# Patient Record
Sex: Female | Born: 1990 | Race: Black or African American | Hispanic: No | Marital: Single | Smoking: Never smoker
Health system: Southern US, Community
[De-identification: ages and names within clinical notes are randomized; demographics above are authoritative.]

## PROBLEM LIST (undated history)

## (undated) DIAGNOSIS — H4710 Unspecified papilledema: Secondary | ICD-10-CM

## (undated) DIAGNOSIS — J45909 Unspecified asthma, uncomplicated: Secondary | ICD-10-CM

## (undated) DIAGNOSIS — A64 Unspecified sexually transmitted disease: Secondary | ICD-10-CM

## (undated) DIAGNOSIS — E063 Autoimmune thyroiditis: Secondary | ICD-10-CM

## (undated) HISTORY — PX: WISDOM TOOTH EXTRACTION: SHX21

## (undated) HISTORY — DX: Unspecified papilledema: H47.10

## (undated) HISTORY — DX: Unspecified sexually transmitted disease: A64

## (undated) HISTORY — DX: Unspecified asthma, uncomplicated: J45.909

## (undated) HISTORY — PX: MYRINGOTOMY: SHX2060

---
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
## 2013-11-07 ENCOUNTER — Ambulatory Visit (INDEPENDENT_AMBULATORY_CARE_PROVIDER_SITE_OTHER): Payer: BC Managed Care – PPO | Admitting: Certified Nurse Midwife

## 2013-11-07 ENCOUNTER — Encounter: Payer: Self-pay | Admitting: Certified Nurse Midwife

## 2013-11-07 VITALS — BP 110/70 | HR 68 | Resp 16 | Ht 62.75 in | Wt 252.0 lb

## 2013-11-07 DIAGNOSIS — B3731 Acute candidiasis of vulva and vagina: Secondary | ICD-10-CM

## 2013-11-07 DIAGNOSIS — Z01419 Encounter for gynecological examination (general) (routine) without abnormal findings: Secondary | ICD-10-CM

## 2013-11-07 DIAGNOSIS — B373 Candidiasis of vulva and vagina: Secondary | ICD-10-CM

## 2013-11-07 DIAGNOSIS — Z124 Encounter for screening for malignant neoplasm of cervix: Secondary | ICD-10-CM

## 2013-11-07 DIAGNOSIS — Z0000 Encounter for general adult medical examination without abnormal findings: Secondary | ICD-10-CM

## 2013-11-07 LAB — POCT URINALYSIS DIPSTICK
Bilirubin, UA: NEGATIVE
Blood, UA: NEGATIVE
Glucose, UA: NEGATIVE
Ketones, UA: NEGATIVE
Nitrite, UA: NEGATIVE
Protein, UA: NEGATIVE
Urobilinogen, UA: NEGATIVE
pH, UA: 5

## 2013-11-07 MED ORDER — TERCONAZOLE 0.4 % VA CREA: 1.0000 | TOPICAL_CREAM | Freq: Every day | VAGINAL | Status: DC

## 2013-11-07 NOTE — Patient Instructions (Signed)
General topics  Next pap or exam is  due in 1 year Take a Women's multivitamin Take 1200 mg. of calcium daily - prefer dietary If any concerns in interim to call back  Breast Self-Awareness Practicing breast self-awareness may pick up problems early, prevent significant medical complications, and possibly save your life. By practicing breast self-awareness, you can become familiar with how your breasts look and feel and if your breasts are changing. This allows you to notice changes early. It can also offer you some reassurance that your breast health is good. One way to learn what is normal for your breasts and whether your breasts are changing is to do a breast self-exam. If you find a lump or something that was not present in the past, it is best to contact your caregiver right away. Other findings that should be evaluated by your caregiver include nipple discharge, especially if it is bloody; skin changes or reddening; areas where the skin seems to be pulled in (retracted); or new lumps and bumps. Breast pain is seldom associated with cancer (malignancy), but should also be evaluated by a caregiver. BREAST SELF-EXAM The best time to examine your breasts is 5 7 days after your menstrual period is over.  ExitCare Patient Information 2013 [employer].   Exercise to Stay Healthy Exercise helps you become and stay healthy. EXERCISE IDEAS AND TIPS Choose exercises that:  You enjoy.  Fit into your day. You do not need to exercise really hard to be healthy. You can do exercises at a slow or medium level and stay healthy. You can:  Stretch before and after working out.  Try yoga, Pilates, or tai chi.  Lift weights.  Walk fast, swim, jog, run, climb stairs, bicycle, dance, or rollerskate.  Take aerobic classes. Exercises that burn about 150 calories:  Running 1  miles in 15 minutes.  Playing volleyball for 45 to 60 minutes.  Washing and waxing a car for 45 to 60  minutes.  Playing touch football for 45 minutes.  Walking 1  miles in 35 minutes.  Pushing a stroller 1  miles in 30 minutes.  Playing basketball for 30 minutes.  Raking leaves for 30 minutes.  Bicycling 5 miles in 30 minutes.  Walking 2 miles in 30 minutes.  Dancing for 30 minutes.  Shoveling snow for 15 minutes.  Swimming laps for 20 minutes.  Walking up stairs for 15 minutes.  Bicycling 4 miles in 15 minutes.  Gardening for 30 to 45 minutes.  Jumping rope for 15 minutes.  Washing windows or floors for 45 to 60 minutes. Document Released: 01/22/2010 Document Revised: 03/14/2011 Document Reviewed: 01/22/2010 ExitCare Patient Information 2013 [employer].   Other topics ( that may be useful information):    Sexually Transmitted Disease Sexually transmitted disease (STD) refers to any infection that is passed from person to person during sexual activity. This may happen by way of saliva, semen, blood, vaginal mucus, or urine. Common STDs include:  Gonorrhea.  Chlamydia.  Syphilis.  HIV/AIDS.  Genital herpes.  Hepatitis B and C.  Trichomonas.  Human papillomavirus (HPV).  Pubic lice. CAUSES  An STD may be spread by bacteria, virus, or parasite. A person can get an STD by:  Sexual intercourse with an infected person.  Sharing sex toys with an infected person.  Sharing needles with an infected person.  Having intimate contact with the genitals, mouth, or rectal areas of an infected person. SYMPTOMS  Some people may not have any symptoms, but   they can still pass the infection to others. Different STDs have different symptoms. Symptoms include:  Painful or bloody urination.  Pain in the pelvis, abdomen, vagina, anus, throat, or eyes.  Skin rash, itching, irritation, growths, or sores (lesions). These usually occur in the genital or anal area.  Abnormal vaginal discharge.  Penile discharge in men.  Soft, flesh-colored skin growths in the  genital or anal area.  Fever.  Pain or bleeding during sexual intercourse.  Swollen glands in the groin area.  Yellow skin and eyes (jaundice). This is seen with hepatitis. DIAGNOSIS  To make a diagnosis, your caregiver may:  Take a medical history.  Perform a physical exam.  Take a specimen (culture) to be examined.  Examine a sample of discharge under a microscope.  Perform blood test TREATMENT   Chlamydia, gonorrhea, trichomonas, and syphilis can be cured with antibiotic medicine.  Genital herpes, hepatitis, and HIV can be treated, but not cured, with prescribed medicines. The medicines will lessen the symptoms.  Genital warts from HPV can be treated with medicine or by freezing, burning (electrocautery), or surgery. Warts may come back.  HPV is a virus and cannot be cured with medicine or surgery.However, abnormal areas may be followed very closely by your caregiver and may be removed from the cervix, vagina, or vulva through office procedures or surgery. If your diagnosis is confirmed, your recent sexual partners need treatment. This is true even if they are symptom-free or have a negative culture or evaluation. They should not have sex until their caregiver says it is okay. HOME CARE INSTRUCTIONS  All sexual partners should be informed, tested, and treated for all STDs.  Take your antibiotics as directed. Finish them even if you start to feel better.  Only take over-the-counter or prescription medicines for pain, discomfort, or fever as directed by your caregiver.  Rest.  Eat a balanced diet and drink enough fluids to keep your urine clear or pale yellow.  Do not have sex until treatment is completed and you have followed up with your caregiver. STDs should be checked after treatment.  Keep all follow-up appointments, Pap tests, and blood tests as directed by your caregiver.  Only use latex condoms and water-soluble lubricants during sexual activity. Do not use  petroleum jelly or oils.  Avoid alcohol and illegal drugs.  Get vaccinated for HPV and hepatitis. If you have not received these vaccines in the past, talk to your caregiver about whether one or both might be right for you.  Avoid risky sex practices that can break the skin. The only way to avoid getting an STD is to avoid all sexual activity.Latex condoms and dental dams (for oral sex) will help lessen the risk of getting an STD, but will not completely eliminate the risk. SEEK MEDICAL CARE IF:   You have a fever.  You have any new or worsening symptoms. Document Released: 03/12/2002 Document Revised: 03/14/2011 Document Reviewed: 03/19/2010 [facility] Patient Information 2013 [employer].    Domestic Abuse You are being battered or abused if someone close to you hits, pushes, or physically hurts you in any way. You also are being abused if you are forced into activities. You are being sexually abused if you are forced to have sexual contact of any kind. You are being emotionally abused if you are made to feel worthless or if you are constantly threatened. It is important to remember that help is available. No one has the right to abuse you. PREVENTION OF FURTHER  ABUSE  Learn the warning signs of danger. This varies with situations but may include: the use of alcohol, threats, isolation from friends and family, or forced sexual contact. Leave if you feel that violence is going to occur.  If you are attacked or beaten, report it to the police so the abuse is documented. You do not have to press charges. The police can protect you while you or the attackers are leaving. Get the officer's name and badge number and a copy of the report.  Find someone you can trust and tell them what is happening to you: your caregiver, a nurse, clergy member, close friend or family member. Feeling ashamed is natural, but remember that you have done nothing wrong. No one deserves abuse. Document Released:  12/18/1999 Document Revised: 03/14/2011 Document Reviewed: 02/25/2010 ExitCare Patient Information 2013 [employer].    How Much is Too Much Alcohol? Drinking too much alcohol can cause injury, accidents, and health problems. These types of problems can include:   Car crashes.  Falls.  Family fighting (domestic violence).  Drowning.  Fights.  Injuries.  Burns.  Damage to certain organs.  Having a baby with birth defects. ONE DRINK CAN BE TOO MUCH WHEN YOU ARE:  Working.  Pregnant or breastfeeding.  Taking medicines. Ask your doctor.  Driving or planning to drive. If you or someone you know has a drinking problem, get help from a doctor.  Document Released: 10/16/2008 Document Revised: 03/14/2011 Document Reviewed: 10/16/2008 ExitCare Patient Information 2013 [employer].   Smoking Hazards Smoking cigarettes is extremely bad for your health. Tobacco smoke has over 200 known poisons in it. There are over 60 chemicals in tobacco smoke that cause cancer. Some of the chemicals found in cigarette smoke include:   Cyanide.  Benzene.  Formaldehyde.  Methanol (wood alcohol).  Acetylene (fuel used in welding torches).  Ammonia. Cigarette smoke also contains the poisonous gases nitrogen oxide and carbon monoxide.  Cigarette smokers have an increased risk of many serious medical problems and Smoking causes approximately:  90% of all lung cancer deaths in men.  80% of all lung cancer deaths in women.  90% of deaths from chronic obstructive lung disease. Compared with nonsmokers, smoking increases the risk of:  Coronary heart disease by 2 to 4 times.  Stroke by 2 to 4 times.  Men developing lung cancer by 23 times.  Women developing lung cancer by 13 times.  Dying from chronic obstructive lung diseases by 12 times.  . Smoking is the most preventable cause of death and disease in our society.  WHY IS SMOKING ADDICTIVE?  Nicotine is the chemical  agent in tobacco that is capable of causing addiction or dependence.  When you smoke and inhale, nicotine is absorbed rapidly into the bloodstream through your lungs. Nicotine absorbed through the lungs is capable of creating a powerful addiction. Both inhaled and non-inhaled nicotine may be addictive.  Addiction studies of cigarettes and spit tobacco show that addiction to nicotine occurs mainly during the teen years, when young people begin using tobacco products. WHAT ARE THE BENEFITS OF QUITTING?  There are many health benefits to quitting smoking.   Likelihood of developing cancer and heart disease decreases. Health improvements are seen almost immediately.  Blood pressure, pulse rate, and breathing patterns start returning to normal soon after quitting. QUITTING SMOKING   American Lung Association - 1-800-LUNGUSA  American Cancer Society - 1-800-ACS-2345 Document Released: 01/28/2004 Document Revised: 03/14/2011 Document Reviewed: 10/01/2008 ExitCare Patient Information 2013 Exit[employer]   LL[employer].   Stress Management Stress is a state of physical or mental tension that often results from changes in your life or normal routine. Some common causes of stress are:  Death of a loved one.  Injuries or severe illnesses.  Getting fired or changing jobs.  Moving into a new home. Other causes may be:  Sexual problems.  Business or financial losses.  Taking on a large debt.  Regular conflict with someone at home or at work.  Constant tiredness from lack of sleep. It is not just bad things that are stressful. It may be stressful to:  Win the lottery.  Get married.  Buy a new car. The amount of stress that can be easily tolerated varies from person to person. Changes generally cause stress, regardless of the types of change. Too much stress can affect your health. It may lead to physical or emotional problems. Too little stress (boredom) may also become stressful. SUGGESTIONS TO  REDUCE STRESS:  Talk things over with your family and friends. It often is helpful to share your concerns and worries. If you feel your problem is serious, you may want to get help from a professional counselor.  Consider your problems one at a time instead of lumping them all together. Trying to take care of everything at once may seem impossible. List all the things you need to do and then start with the most important one. Set a goal to accomplish 2 or 3 things each day. If you expect to do too many in a single day you will naturally fail, causing you to feel even more stressed.  Do not use alcohol or drugs to relieve stress. Although you may feel better for a short time, they do not remove the problems that caused the stress. They can also be habit forming.  Exercise regularly - at least 3 times per week. Physical exercise can help to relieve that "uptight" feeling and will relax you.  The shortest distance between despair and hope is often a good night's sleep.  Go to bed and get up on time allowing yourself time for appointments without being rushed.  Take a short "time-out" period from any stressful situation that occurs during the day. Close your eyes and take some deep breaths. Starting with the muscles in your face, tense them, hold it for a few seconds, then relax. Repeat this with the muscles in your neck, shoulders, hand, stomach, back and legs.  Take good care of yourself. Eat a balanced diet and get plenty of rest.  Schedule time for having fun. Take a break from your daily routine to relax. HOME CARE INSTRUCTIONS   Call if you feel overwhelmed by your problems and feel you can no longer manage them on your own.  Return immediately if you feel like hurting yourself or someone else. Document Released: 06/15/2000 Document Revised: 03/14/2011 Document Reviewed: 02/05/2007 ExitCare Patient Information 2013 [employer].   

## 2013-11-07 NOTE — Progress Notes (Signed)
23 y.o. G0P0000 Single African American Fe here to establish gyn care and for annual exam.Periods normal, regular, minimal cramping. Contraception condoms consistent.  Partner change and desires STD screening. Sees Urgent care if needed. Complaining of vaginal discharge with itching and increase discharge, no odor for about 2 weeks. Denies new personal products or other changes. Does change brand of condoms often. No other health issues today.  Patient's last menstrual period was 10/29/2013.          Sexually active: Yes.    The current method of family planning is condoms & witdrawal method.    Exercising: Yes.    yoga Smoker:  no  Health Maintenance: Pap:  none MMG:  none Colonoscopy:  none BMD:   none TDaP:  2007 Labs: Poct urine-wbc-tr, hgb-12.9 Self breast exam:done occ   reports that she has never smoked. She does not have any smokeless tobacco history on file. She reports that she drinks about 0.6 oz of alcohol per week. She reports that she does not use illicit drugs.  Past Medical History  Diagnosis Date  . Asthma     as a child    Past Surgical History  Procedure Laterality Date  . Tonsillectomy    . Wisdom tooth extraction      Current Outpatient Prescriptions  Medication Sig Dispense Refill  . fluticasone (FLONASE) 50 MCG/ACT nasal spray Place 2 sprays into the nose.    . loratadine (CLARITIN) 10 MG tablet Take 10 mg by mouth.     No current facility-administered medications for this visit.    History reviewed. No pertinent family history.  ROS:  Pertinent items are noted in HPI.  Otherwise, a comprehensive ROS was negative.  Exam:   BP 110/70 mmHg  Pulse 68  Resp 16  Ht 5' 2.75" (1.594 m)  Wt 252 lb (114.306 kg)  BMI 44.99 kg/m2  LMP 10/29/2013 Height: 5' 2.75" (159.4 cm)  Ht Readings from Last 3 Encounters:  11/07/13 5' 2.75" (1.594 m)    General appearance: alert, cooperative and appears stated age Head: Normocephalic, without obvious  abnormality, atraumatic Neck: no adenopathy, supple, symmetrical, trachea midline and thyroid normal to inspection and palpation Lungs: clear to auscultation bilaterally Breasts: normal appearance, no masses or tenderness, No nipple retraction or dimpling, No nipple discharge or bleeding, No axillary or supraclavicular adenopathy Heart: regular rate and rhythm Abdomen: soft, non-tender; no masses,  no organomegaly Extremities: extremities normal, atraumatic, no cyanosis or edema Skin: Skin color, texture, turgor normal. No rashes or lesions Lymph nodes: Cervical, supraclavicular, and axillary nodes normal. No abnormal inguinal nodes palpated Neurologic: Grossly normal   Pelvic: External genitalia:  no lesions              Urethra:  normal appearing urethra with no masses, tenderness or lesions              Bartholin's and Skene's: normal                 Vagina: normal appearing vagina with normal color and white thick discharge, no lesions ph 4.0 wet prep taken              Cervix: normal, non tender, no lesions              Pap taken: Yes.   Bimanual Exam:  Uterus:  normal size, contour, position, consistency, mobility, non-tender and mid position              Adnexa: normal adnexa  and no mass, fullness, tenderness               Rectovaginal: Confirms               Anus:  Normal appearance  Wet prep: positive yeast  A:  Well Woman with normal exam  Contraception condoms consistent  STD Screening  Yeast vaginitis  Gardasil candidate  P:   Reviewed health and wellness pertinent to exam  Discussed importance of consistent use for contraception.  Lab GC,Chlamydia, HIV,RPR  Reviewed finding of yeast vaginitis and etiology. Discussed avoiding high perfumed products or frequent product change, which can encourage yeast growth.  Rx Terazol 7 see order with instructions  Given information regarding Gardasil, will decide and advise  Pap smear taken today with HPV reflex   counseled on  breast self exam, STD prevention, HIV risk factors and prevention, adequate intake of calcium and vitamin D, diet and exercise, encouraged to work on portion control and good eating habits for weight control.  return annually or prn  An After Visit Summary was printed and given to the patient.

## 2013-11-08 LAB — HEMOGLOBIN, FINGERSTICK: Hemoglobin, fingerstick: 12.9 g/dL (ref 12.0–16.0)

## 2013-11-08 LAB — STD PANEL
HIV 1&2 Ab, 4th Generation: NONREACTIVE
Hepatitis B Surface Ag: NEGATIVE

## 2013-11-08 LAB — IPS PAP TEST WITH REFLEX TO HPV

## 2013-11-08 LAB — IPS N GONORRHOEA AND CHLAMYDIA BY PCR

## 2013-11-12 NOTE — Progress Notes (Signed)
Reviewed personally.  M. Suzanne Trejon Duford, MD.  

## 2013-11-19 ENCOUNTER — Ambulatory Visit (INDEPENDENT_AMBULATORY_CARE_PROVIDER_SITE_OTHER): Payer: BC Managed Care – PPO | Admitting: Certified Nurse Midwife

## 2013-11-19 ENCOUNTER — Encounter: Payer: Self-pay | Admitting: Certified Nurse Midwife

## 2013-11-19 VITALS — BP 118/70 | HR 70 | Resp 16 | Ht 62.75 in | Wt 257.0 lb

## 2013-11-19 DIAGNOSIS — Z308 Encounter for other contraceptive management: Secondary | ICD-10-CM

## 2013-11-19 DIAGNOSIS — Z23 Encounter for immunization: Secondary | ICD-10-CM

## 2013-11-19 DIAGNOSIS — Z30017 Encounter for initial prescription of implantable subdermal contraceptive: Secondary | ICD-10-CM

## 2013-11-19 NOTE — Patient Instructions (Signed)
Etonogestrel implant What is this medicine? ETONOGESTREL (et oh noe JES trel) is a contraceptive (birth control) device. It is used to prevent pregnancy. It can be used for up to 3 years. This medicine may be used for other purposes; ask your health care provider or pharmacist if you have questions. COMMON BRAND NAME(S): Implanon, Nexplanon What should I tell my health care provider before I take this medicine? They need to know if you have any of these conditions: -abnormal vaginal bleeding -blood vessel disease or blood clots -cancer of the breast, cervix, or liver -depression -diabetes -gallbladder disease -headaches -heart disease or recent heart attack -high blood pressure -high cholesterol -kidney disease -liver disease -renal disease -seizures -tobacco smoker -an unusual or allergic reaction to etonogestrel, other hormones, anesthetics or antiseptics, medicines, foods, dyes, or preservatives -pregnant or trying to get pregnant -breast-feeding How should I use this medicine? This device is inserted just under the skin on the inner side of your upper arm by a health care professional. Talk to your pediatrician regarding the use of this medicine in children. Special care may be needed. Overdosage: If you think you've taken too much of this medicine contact a poison control center or emergency room at once. Overdosage: If you think you have taken too much of this medicine contact a poison control center or emergency room at once. NOTE: This medicine is only for you. Do not share this medicine with others. What if I miss a dose? This does not apply. What may interact with this medicine? Do not take this medicine with any of the following medications: -amprenavir -bosentan -fosamprenavir This medicine may also interact with the following medications: -barbiturate medicines for inducing sleep or treating seizures -certain medicines for fungal infections like ketoconazole and  itraconazole -griseofulvin -medicines to treat seizures like carbamazepine, felbamate, oxcarbazepine, phenytoin, topiramate -modafinil -phenylbutazone -rifampin -some medicines to treat HIV infection like atazanavir, indinavir, lopinavir, nelfinavir, tipranavir, ritonavir -St. John's wort This list may not describe all possible interactions. Give your health care provider a list of all the medicines, herbs, non-prescription drugs, or dietary supplements you use. Also tell them if you smoke, drink alcohol, or use illegal drugs. Some items may interact with your medicine. What should I watch for while using this medicine? This product does not protect you against HIV infection (AIDS) or other sexually transmitted diseases. You should be able to feel the implant by pressing your fingertips over the skin where it was inserted. Tell your doctor if you cannot feel the implant. What side effects may I notice from receiving this medicine? Side effects that you should report to your doctor or health care professional as soon as possible: -allergic reactions like skin rash, itching or hives, swelling of the face, lips, or tongue -breast lumps -changes in vision -confusion, trouble speaking or understanding -dark urine -depressed mood -general ill feeling or flu-like symptoms -light-colored stools -loss of appetite, nausea -right upper belly pain -severe headaches -severe pain, swelling, or tenderness in the abdomen -shortness of breath, chest pain, swelling in a leg -signs of pregnancy -sudden numbness or weakness of the face, arm or leg -trouble walking, dizziness, loss of balance or coordination -unusual vaginal bleeding, discharge -unusually weak or tired -yellowing of the eyes or skin Side effects that usually do not require medical attention (Report these to your doctor or health care professional if they continue or are bothersome.): -acne -breast pain -changes in  weight -cough -fever or chills -headache -irregular menstrual bleeding -itching, burning, and   vaginal discharge -pain or difficulty passing urine -sore throat This list may not describe all possible side effects. Call your doctor for medical advice about side effects. You may report side effects to FDA at 1-800-FDA-1088. Where should I keep my medicine? This drug is given in a hospital or clinic and will not be stored at home. NOTE: This sheet is a summary. It may not cover all possible information. If you have questions about this medicine, talk to your doctor, pharmacist, or health care provider.  2015, Elsevier/Gold Standard. (2011-06-27 15:37:45)  

## 2013-11-19 NOTE — Progress Notes (Signed)
23 y.o. Single African American G0P0000 here for Gardasil initial injection. She has decided she would like to start the series. Has read information and feels well informed. No questions. Patient also here to discuss Nexplanon use for contraception. Patient previous experience with OCP and would forget them. Currently using condoms, not always consistent. Patient has read online all the risks and benefits and seen the demonstration product. She feels this is a good choice.   O: Healthy female, WD WN Affect: normal orientation X 3    A:Gardasil candidate Contraception desires Nexplanon  P: Patient aware of benefits and risks, has signed consent and request Gardasil series today. Discussed risks and benefits and bleeding profile expectations. Discussed must use consistent condoms prior to insertion and needs to be on day 1-5 of menses for insertion. Patient voiced understanding. Shown on arm where placement would be. Patient instructed she will be called with insurance info and then will be instructed to call when menses starts for insertion. Questions addressed.   26 minutes spent with patient in face to face counseling regarding Gardasil and Nexplanon.  RV as above

## 2013-11-22 ENCOUNTER — Telehealth: Payer: Self-pay | Admitting: Certified Nurse Midwife

## 2013-11-22 NOTE — Telephone Encounter (Signed)
Left message for patient to call back. Need to go over contraception benefits. Pr $25

## 2013-11-22 NOTE — Progress Notes (Signed)
Reviewed personally.  M. Suzanne Reina Wilton, MD.  

## 2013-11-22 NOTE — Telephone Encounter (Signed)
Spoke with patient. Advised that per benefits quote received, nexplanon/implanon is covered at 100% after a $25 copay. Patient is to call within the first 5 days of her cycle to schedule insertion.

## 2013-12-05 ENCOUNTER — Telehealth: Payer: Self-pay | Admitting: Certified Nurse Midwife

## 2013-12-05 NOTE — Telephone Encounter (Signed)
Return call to patient. States menses started today. Appointment for nexplanon insertion scheduled for tomorrow 12-06-13 at 1000 with Debbi.  Routing to provider for final review. Patient agreeable to disposition. Will close encounter

## 2013-12-05 NOTE — Telephone Encounter (Signed)
Patient calling to schedule Nexplanon. She started her menstrual cycle today.

## 2013-12-06 ENCOUNTER — Encounter: Payer: Self-pay | Admitting: Certified Nurse Midwife

## 2013-12-06 ENCOUNTER — Ambulatory Visit (INDEPENDENT_AMBULATORY_CARE_PROVIDER_SITE_OTHER): Payer: BC Managed Care – PPO | Admitting: Certified Nurse Midwife

## 2013-12-06 DIAGNOSIS — Z30017 Encounter for initial prescription of implantable subdermal contraceptive: Secondary | ICD-10-CM

## 2013-12-06 DIAGNOSIS — Z308 Encounter for other contraceptive management: Secondary | ICD-10-CM

## 2013-12-06 LAB — POCT URINE PREGNANCY: PREG TEST UR: NEGATIVE

## 2013-12-06 NOTE — Patient Instructions (Signed)
Etonogestrel implant What is this medicine? ETONOGESTREL (et oh noe JES trel) is a contraceptive (birth control) device. It is used to prevent pregnancy. It can be used for up to 3 years. This medicine may be used for other purposes; ask your health care provider or pharmacist if you have questions. COMMON BRAND NAME(S): Implanon, Nexplanon What should I tell my health care provider before I take this medicine? They need to know if you have any of these conditions: -abnormal vaginal bleeding -blood vessel disease or blood clots -cancer of the breast, cervix, or liver -depression -diabetes -gallbladder disease -headaches -heart disease or recent heart attack -high blood pressure -high cholesterol -kidney disease -liver disease -renal disease -seizures -tobacco smoker -an unusual or allergic reaction to etonogestrel, other hormones, anesthetics or antiseptics, medicines, foods, dyes, or preservatives -pregnant or trying to get pregnant -breast-feeding How should I use this medicine? This device is inserted just under the skin on the inner side of your upper arm by a health care professional. Talk to your pediatrician regarding the use of this medicine in children. Special care may be needed. Overdosage: If you think you've taken too much of this medicine contact a poison control center or emergency room at once. Overdosage: If you think you have taken too much of this medicine contact a poison control center or emergency room at once. NOTE: This medicine is only for you. Do not share this medicine with others. What if I miss a dose? This does not apply. What may interact with this medicine? Do not take this medicine with any of the following medications: -amprenavir -bosentan -fosamprenavir This medicine may also interact with the following medications: -barbiturate medicines for inducing sleep or treating seizures -certain medicines for fungal infections like ketoconazole and  itraconazole -griseofulvin -medicines to treat seizures like carbamazepine, felbamate, oxcarbazepine, phenytoin, topiramate -modafinil -phenylbutazone -rifampin -some medicines to treat HIV infection like atazanavir, indinavir, lopinavir, nelfinavir, tipranavir, ritonavir -St. John's wort This list may not describe all possible interactions. Give your health care provider a list of all the medicines, herbs, non-prescription drugs, or dietary supplements you use. Also tell them if you smoke, drink alcohol, or use illegal drugs. Some items may interact with your medicine. What should I watch for while using this medicine? This product does not protect you against HIV infection (AIDS) or other sexually transmitted diseases. You should be able to feel the implant by pressing your fingertips over the skin where it was inserted. Tell your doctor if you cannot feel the implant. What side effects may I notice from receiving this medicine? Side effects that you should report to your doctor or health care professional as soon as possible: -allergic reactions like skin rash, itching or hives, swelling of the face, lips, or tongue -breast lumps -changes in vision -confusion, trouble speaking or understanding -dark urine -depressed mood -general ill feeling or flu-like symptoms -light-colored stools -loss of appetite, nausea -right upper belly pain -severe headaches -severe pain, swelling, or tenderness in the abdomen -shortness of breath, chest pain, swelling in a leg -signs of pregnancy -sudden numbness or weakness of the face, arm or leg -trouble walking, dizziness, loss of balance or coordination -unusual vaginal bleeding, discharge -unusually weak or tired -yellowing of the eyes or skin Side effects that usually do not require medical attention (Report these to your doctor or health care professional if they continue or are bothersome.): -acne -breast pain -changes in  weight -cough -fever or chills -headache -irregular menstrual bleeding -itching, burning, and   vaginal discharge -pain or difficulty passing urine -sore throat This list may not describe all possible side effects. Call your doctor for medical advice about side effects. You may report side effects to FDA at 1-800-FDA-1088. Where should I keep my medicine? This drug is given in a hospital or clinic and will not be stored at home. NOTE: This sheet is a summary. It may not cover all possible information. If you have questions about this medicine, talk to your doctor, pharmacist, or health care provider.  2015, Elsevier/Gold Standard. (2011-06-27 15:37:45)  

## 2013-12-06 NOTE — Progress Notes (Signed)
23 yrsAfrican American Single female G0P0000  presents for /Nexplanon insertion. LMP12/3/15         Contraception Consistent condom use.  Questions addressed.  Consent signed.  HPI neg.  Exam: time, place and person Healthy female  Procedure: Patient placed supine on exam table with herright arm   flexed at the elbow and externally rotated.The insertion site was identified on the inner side of upper arm.  Two marks were made 8-10cm above the medial epicondyle of the humerus.  The first mark for insertion and the second mark 4cm from the first. The insertion sited was cleansed with betadine solution. 2 ml of 1% Lidocaine was injected along the track of the insertion site. The Nexaplon under sterile conditions was inserted in left arm. The implant was palpated in the arm after insertion. A small adhesive bandage placed over insertion site. The patient palpated the device for monitoring of the device. No active bleeding was noted.  A pressure bandage was place over the site.  Assessment:/Nexaplon Insertion Pt tolerated procedure well.  Plan:Instructions and warning signs and symptoms given  Questions addressed.   Return visit30days Rv prn

## 2013-12-11 NOTE — Progress Notes (Signed)
Reviewed personally.  M. Suzanne Ladene Allocca, MD.  

## 2014-01-20 ENCOUNTER — Ambulatory Visit: Payer: BLUE CROSS/BLUE SHIELD

## 2014-01-20 ENCOUNTER — Encounter: Payer: Self-pay | Admitting: Certified Nurse Midwife

## 2014-01-20 ENCOUNTER — Ambulatory Visit (INDEPENDENT_AMBULATORY_CARE_PROVIDER_SITE_OTHER): Payer: BLUE CROSS/BLUE SHIELD | Admitting: Certified Nurse Midwife

## 2014-01-20 VITALS — BP 100/74 | HR 60 | Ht 62.75 in | Wt 248.0 lb

## 2014-01-20 DIAGNOSIS — Z3049 Encounter for surveillance of other contraceptives: Secondary | ICD-10-CM

## 2014-01-20 DIAGNOSIS — Z23 Encounter for immunization: Secondary | ICD-10-CM

## 2014-01-20 NOTE — Patient Instructions (Signed)
Etonogestrel implant What is this medicine? ETONOGESTREL (et oh noe JES trel) is a contraceptive (birth control) device. It is used to prevent pregnancy. It can be used for up to 3 years. This medicine may be used for other purposes; ask your health care provider or pharmacist if you have questions. COMMON BRAND NAME(S): Implanon, Nexplanon What should I tell my health care provider before I take this medicine? They need to know if you have any of these conditions: -abnormal vaginal bleeding -blood vessel disease or blood clots -cancer of the breast, cervix, or liver -depression -diabetes -gallbladder disease -headaches -heart disease or recent heart attack -high blood pressure -high cholesterol -kidney disease -liver disease -renal disease -seizures -tobacco smoker -an unusual or allergic reaction to etonogestrel, other hormones, anesthetics or antiseptics, medicines, foods, dyes, or preservatives -pregnant or trying to get pregnant -breast-feeding How should I use this medicine? This device is inserted just under the skin on the inner side of your upper arm by a health care professional. Talk to your pediatrician regarding the use of this medicine in children. Special care may be needed. Overdosage: If you think you've taken too much of this medicine contact a poison control center or emergency room at once. Overdosage: If you think you have taken too much of this medicine contact a poison control center or emergency room at once. NOTE: This medicine is only for you. Do not share this medicine with others. What if I miss a dose? This does not apply. What may interact with this medicine? Do not take this medicine with any of the following medications: -amprenavir -bosentan -fosamprenavir This medicine may also interact with the following medications: -barbiturate medicines for inducing sleep or treating seizures -certain medicines for fungal infections like ketoconazole and  itraconazole -griseofulvin -medicines to treat seizures like carbamazepine, felbamate, oxcarbazepine, phenytoin, topiramate -modafinil -phenylbutazone -rifampin -some medicines to treat HIV infection like atazanavir, indinavir, lopinavir, nelfinavir, tipranavir, ritonavir -St. John's wort This list may not describe all possible interactions. Give your health care provider a list of all the medicines, herbs, non-prescription drugs, or dietary supplements you use. Also tell them if you smoke, drink alcohol, or use illegal drugs. Some items may interact with your medicine. What should I watch for while using this medicine? This product does not protect you against HIV infection (AIDS) or other sexually transmitted diseases. You should be able to feel the implant by pressing your fingertips over the skin where it was inserted. Tell your doctor if you cannot feel the implant. What side effects may I notice from receiving this medicine? Side effects that you should report to your doctor or health care professional as soon as possible: -allergic reactions like skin rash, itching or hives, swelling of the face, lips, or tongue -breast lumps -changes in vision -confusion, trouble speaking or understanding -dark urine -depressed mood -general ill feeling or flu-like symptoms -light-colored stools -loss of appetite, nausea -right upper belly pain -severe headaches -severe pain, swelling, or tenderness in the abdomen -shortness of breath, chest pain, swelling in a leg -signs of pregnancy -sudden numbness or weakness of the face, arm or leg -trouble walking, dizziness, loss of balance or coordination -unusual vaginal bleeding, discharge -unusually weak or tired -yellowing of the eyes or skin Side effects that usually do not require medical attention (Report these to your doctor or health care professional if they continue or are bothersome.): -acne -breast pain -changes in  weight -cough -fever or chills -headache -irregular menstrual bleeding -itching, burning, and   vaginal discharge -pain or difficulty passing urine -sore throat This list may not describe all possible side effects. Call your doctor for medical advice about side effects. You may report side effects to FDA at 1-800-FDA-1088. Where should I keep my medicine? This drug is given in a hospital or clinic and will not be stored at home. NOTE: This sheet is a summary. It may not cover all possible information. If you have questions about this medicine, talk to your doctor, pharmacist, or health care provider.  2015, Elsevier/Gold Standard. (2011-06-27 15:37:45)  

## 2014-01-20 NOTE — Progress Notes (Signed)
Reviewed personally.  M. Suzanne Everlena Mackley, MD.  

## 2014-01-20 NOTE — Progress Notes (Signed)
24 y.o. Single African American G0P0000 here for evaluation of Nexplanon  initiated on 12/06/13 for contraception.. Menses  none since insertion. Has noted a slight increase in vaginal discharge after insertion, but has resolved. Patient is able to feel Nexplanon under the skin.Denies any other problems. Denies any problems after insertion. Happy with choice. Patient also here for 2nd Gardasil.   O: Healthy female, WD WN Affect: normal orientation X 3  Left arm: insertion site of Nexplanon noted, with Nexplanon  Palpated intact.   A:Surveillance of Nexplanon 2nd Gardasil due  P: Aware of warning signs with Nexplanon and bleeding expectations. Will advise if problems   RV 4 months, prn

## 2014-01-29 ENCOUNTER — Telehealth: Payer: Self-pay | Admitting: Certified Nurse Midwife

## 2014-01-29 NOTE — Telephone Encounter (Signed)
Spoke with patient. Patient states that since having the nexplanon inserted she has been experiencing mood swings and insomnia. "The insomnia seems to be getting worse. It is like I can never sleep at night any more. I have never had mood swings or anxiety really before this. The anxiety really just started two weeks ago." Patient would like to come in to see Anita Patton CNM to discuss as she feels it may be related to the nexplanon. Appointment scheduled for 02/04/14 at 11:15am. Patient is agreeable to appointment date and time.  Routing to provider for final review. Patient agreeable to disposition. Will close encounter

## 2014-01-29 NOTE — Telephone Encounter (Signed)
Pt says she is experiencing mood swings, anxiety, and insomnia. Thinks she may be having side affects from the nexplanon.

## 2014-02-04 ENCOUNTER — Ambulatory Visit: Payer: BLUE CROSS/BLUE SHIELD | Admitting: Certified Nurse Midwife

## 2014-02-04 ENCOUNTER — Encounter: Payer: Self-pay | Admitting: Certified Nurse Midwife

## 2014-02-04 ENCOUNTER — Telehealth: Payer: Self-pay | Admitting: Certified Nurse Midwife

## 2014-02-04 NOTE — Telephone Encounter (Signed)
Left message for pt to call and reschedule missed appointment °

## 2014-05-21 ENCOUNTER — Ambulatory Visit (INDEPENDENT_AMBULATORY_CARE_PROVIDER_SITE_OTHER): Payer: BLUE CROSS/BLUE SHIELD | Admitting: *Deleted

## 2014-05-21 VITALS — BP 118/76 | HR 60 | Ht 62.75 in | Wt 259.0 lb

## 2014-05-21 DIAGNOSIS — Z23 Encounter for immunization: Secondary | ICD-10-CM | POA: Diagnosis not present

## 2014-05-21 NOTE — Progress Notes (Signed)
Patient ID: Anita Patton, female   DOB: 07-12-1990, 24 y.o.   MRN: [id number]   Pt arrived for third Gardasil injection.  Pt denies any complications from previous injection(s).  Pt tolerated well in left deltoid.  Pt has completed series.  She will return in November for AEX or prn if problems.

## 2014-07-04 ENCOUNTER — Telehealth: Payer: Self-pay | Admitting: Certified Nurse Midwife

## 2014-07-04 DIAGNOSIS — Z3046 Encounter for surveillance of implantable subdermal contraceptive: Secondary | ICD-10-CM

## 2014-07-04 NOTE — Telephone Encounter (Signed)
Spoke with patient. Patient states that she would like to be seen for Nexplanon removal and STD screening. Denies any current STD symptoms. Concerned of exposure. Would like nexplanon removed as she feels more anxious since having nexplanon and states site around nexplanon is uncomfortable. Denies any redness or swelling to the area. Denies hitting arm or anything that may have injured nexplanon. Appointment scheduled for 07/08/2014 for nexplanon removal and STD testing at 2 pm with  Verner Choleborah S. Leonard CNM. Patient is agreeable to date and time. Order placed for precert.  Cc: Braxton Feathersebecca Frahm  Routing to provider for final review. Patient agreeable to disposition. Will close encounter.   Patient aware provider will review message and nurse will return call if any additional advice or change of disposition.

## 2014-07-04 NOTE — Telephone Encounter (Signed)
>','<<   Less Detail',event)" href="javascript:;">More Detail >>   Appointment Request   Anita MangoKabrina M Ruggirello   Sent: Caleen EssexFri July 04, 2014 2:47 PM   To: Sharol Given Gwh [address]    Anita MangoKabrina M Blazina   MRN: [id number] DOB: 06/18/1990   Pt Home: [id number]     Entered: [phone]      Message    Appointment Request From: Anita MangoKabrina M Sagraves      With Provider: Casimer BilisLEONARD,DEBORAH, CNM [[facility]]      Preferred Date Range: From 07/04/2014 To 07/11/2014      Preferred Times: Any      Reason for visit: Office Visit      Comments:   I would like an STD screening and to discuss removing my current birth control [Nexplanon]

## 2014-07-08 ENCOUNTER — Encounter: Payer: Self-pay | Admitting: Certified Nurse Midwife

## 2014-07-08 ENCOUNTER — Ambulatory Visit (INDEPENDENT_AMBULATORY_CARE_PROVIDER_SITE_OTHER): Payer: BLUE CROSS/BLUE SHIELD | Admitting: Certified Nurse Midwife

## 2014-07-08 VITALS — BP 106/72 | HR 70 | Resp 20 | Ht 62.75 in | Wt 261.0 lb

## 2014-07-08 DIAGNOSIS — Z308 Encounter for other contraceptive management: Secondary | ICD-10-CM | POA: Diagnosis not present

## 2014-07-08 DIAGNOSIS — Z113 Encounter for screening for infections with a predominantly sexual mode of transmission: Secondary | ICD-10-CM

## 2014-07-08 DIAGNOSIS — Z3046 Encounter for surveillance of implantable subdermal contraceptive: Secondary | ICD-10-CM

## 2014-07-08 DIAGNOSIS — Z30011 Encounter for initial prescription of contraceptive pills: Secondary | ICD-10-CM

## 2014-07-08 MED ORDER — NORETHIN ACE-ETH ESTRAD-FE 1-20 MG-MCG(24) PO TABS: 1.0000 | ORAL_TABLET | Freq: Every day | ORAL | Status: DC

## 2014-07-08 NOTE — Progress Notes (Signed)
23 yrsAfrican AmericanSinglefemaleG0P0000  presents for Nexplanon removal.   LMP7/4/16 currently still on         Contraceptionoral contraceptives planned. Questions addressed.  Consent signed. Requests same as above. Patient also would like to have STD screening for GC/Chlamydia and trichomonas. Treated in 3/16 for Chlamydia and is due for TOC.  HPI neg.  Affect normal, orientation x 3 Healthy female  Procedure: Patient placed supine on exam table with herleft arm   flexed at the elbow and externally rotated.The insertion site was identified on the inner side of upper arm.The device was palpated. Incision site for removal was marked. The removal site was cleansed with betadine solution and 1 ml of 1% Lidocaine( lot [id number] exp 01-04-15) was injected at the incision site. A small 2 mm incision was made at the distal end of the implant.The Nexaplon implant was grasped with curved forceps and removed gently intact.The implant was shown to the patient and discarded. The incision was closed with a steri strip.  No active bleeding was noted. A small adhesive bandage placed over the removal site.  A pressure bandage was place over the site.  Assessment:Nexaplon Removal Pt tolerated procedure well. Instructions and warning signs given. Instructions on bandage removal given. Questions addressed. Contraception OCP desired STD screening(TOC chlamydia, treated by another provider)  P:Planned contraception:Discussed OCP use risks and benefits. Discussed starting Loestrin 24 Fe today after Rx picked up. Will need backup contraception during first month of use. Rx Loestrin 24 Fe see order Discussed STD prevention and questions addressed. Lab: GC,Chlamydia, Affirm  Rv prn.

## 2014-07-08 NOTE — Patient Instructions (Signed)
Oral Contraception Use Oral contraceptive pills (OCPs) are medicines taken to prevent pregnancy. OCPs work by preventing the ovaries from releasing eggs. The hormones in OCPs also cause the cervical mucus to thicken, preventing the sperm from entering the uterus. The hormones also cause the uterine lining to become thin, not allowing a fertilized egg to attach to the inside of the uterus. OCPs are highly effective when taken exactly as prescribed. However, OCPs do not prevent sexually transmitted diseases (STDs). Safe sex practices, such as using condoms along with an OCP, can help prevent STDs. Before taking OCPs, you may have a physical exam and Pap test. Your health care provider may also order blood tests if necessary. Your health care provider will make sure you are a good candidate for oral contraception. Discuss with your health care provider the possible side effects of the OCP you may be prescribed. When starting an OCP, it can take 2 to 3 months for the body to adjust to the changes in hormone levels in your body.  HOW TO TAKE ORAL CONTRACEPTIVE PILLS Your health care provider may advise you on how to start taking the first cycle of OCPs. Otherwise, you can:   Start on day 1 of your menstrual period. You will not need any backup contraceptive protection with this start time.   Start on the first Sunday after your menstrual period or the day you get your prescription. In these cases, you will need to use backup contraceptive protection for the first week.   Start the pill at any time of your cycle. If you take the pill within 5 days of the start of your period, you are protected against pregnancy right away. In this case, you will not need a backup form of birth control. If you start at any other time of your menstrual cycle, you will need to use another form of birth control for 7 days. If your OCP is the type called a minipill, it will protect you from pregnancy after taking it for 2 days (48  hours). After you have started taking OCPs:   If you forget to take 1 pill, take it as soon as you remember. Take the next pill at the regular time.   If you miss 2 or more pills, call your health care provider because different pills have different instructions for missed doses. Use backup birth control until your next menstrual period starts.   If you use a 28-day pack that contains inactive pills and you miss 1 of the last 7 pills (pills with no hormones), it will not matter. Throw away the rest of the non-hormone pills and start a new pill pack.  No matter which day you start the OCP, you will always start a new pack on that same day of the week. Have an extra pack of OCPs and a backup contraceptive method available in case you miss some pills or lose your OCP pack.  HOME CARE INSTRUCTIONS   Do not smoke.   Always use a condom to protect against STDs. OCPs do not protect against STDs.   Use a calendar to mark your menstrual period days.   Read the information and directions that came with your OCP. Talk to your health care provider if you have questions.  SEEK MEDICAL CARE IF:   You develop nausea and vomiting.   You have abnormal vaginal discharge or bleeding.   You develop a rash.   You miss your menstrual period.   You are losing   your hair.   You need treatment for mood swings or depression.   You get dizzy when taking the OCP.   You develop acne from taking the OCP.   You become pregnant.  SEEK IMMEDIATE MEDICAL CARE IF:   You develop chest pain.   You develop shortness of breath.   You have an uncontrolled or severe headache.   You develop numbness or slurred speech.   You develop visual problems.   You develop pain, redness, and swelling in the legs.  Document Released: 12/09/2010 Document Revised: 05/06/2013 Document Reviewed: 06/10/2012 Exit[employer] Patient Information 2015 [employer]. This information is not intended to replace  advice given to you by your health care provider. Make sure you discuss any questions you have with your health care provider.  

## 2014-07-09 ENCOUNTER — Telehealth: Payer: Self-pay

## 2014-07-09 ENCOUNTER — Other Ambulatory Visit: Payer: Self-pay | Admitting: Certified Nurse Midwife

## 2014-07-09 DIAGNOSIS — N760 Acute vaginitis: Principal | ICD-10-CM

## 2014-07-09 DIAGNOSIS — B9689 Other specified bacterial agents as the cause of diseases classified elsewhere: Secondary | ICD-10-CM

## 2014-07-09 LAB — WET PREP BY MOLECULAR PROBE
CANDIDA SPECIES: NEGATIVE
GARDNERELLA VAGINALIS: POSITIVE — AB
Trichomonas vaginosis: NEGATIVE

## 2014-07-09 MED ORDER — HYLAFEM VA SUPP: 1.0000 | Freq: Every day | VAGINAL | Status: DC

## 2014-07-09 NOTE — Telephone Encounter (Signed)
lmtcb

## 2014-07-09 NOTE — Telephone Encounter (Signed)
Patient notified of results. See lab 

## 2014-07-09 NOTE — Progress Notes (Signed)
Encounter reviewed by Dr. Ambria Mayfield Amundson C. Silva.  

## 2014-07-09 NOTE — Telephone Encounter (Signed)
-----   Message from Verner Choleborah S Leonard, CNM sent at 07/09/2014  7:25 AM EDT ----- Notify patient that her affirm showed positive for BV order in for Hylafem supp. Insert one nightly x 3 it takes one week to essentially clear and another week for vagina to return to normal usually. Pharmacy may have to order and this is fine to wait to treat.  Yeast and trichomonas negative. GC,Chlamydia pending

## 2014-07-10 LAB — IPS N GONORRHOEA AND CHLAMYDIA BY PCR

## 2014-11-13 ENCOUNTER — Encounter: Payer: Self-pay | Admitting: Certified Nurse Midwife

## 2014-11-13 ENCOUNTER — Ambulatory Visit (INDEPENDENT_AMBULATORY_CARE_PROVIDER_SITE_OTHER): Payer: PRIVATE HEALTH INSURANCE | Admitting: Certified Nurse Midwife

## 2014-11-13 VITALS — BP 110/78 | HR 70 | Resp 16 | Ht 63.25 in | Wt 273.0 lb

## 2014-11-13 DIAGNOSIS — Z01419 Encounter for gynecological examination (general) (routine) without abnormal findings: Secondary | ICD-10-CM | POA: Diagnosis not present

## 2014-11-13 DIAGNOSIS — Z0000 Encounter for general adult medical examination without abnormal findings: Secondary | ICD-10-CM

## 2014-11-13 DIAGNOSIS — E669 Obesity, unspecified: Secondary | ICD-10-CM

## 2014-11-13 LAB — HEMOGLOBIN, FINGERSTICK: Hemoglobin, fingerstick: 13.6 g/dL (ref 12.0–16.0)

## 2014-11-13 NOTE — Patient Instructions (Signed)
General topics  Next pap or exam is  due in 1 year Take a Women's multivitamin Take 1200 mg. of calcium daily - prefer dietary If any concerns in interim to call back  Breast Self-Awareness Practicing breast self-awareness may pick up problems early, prevent significant medical complications, and possibly save your life. By practicing breast self-awareness, you can become familiar with how your breasts look and feel and if your breasts are changing. This allows you to notice changes early. It can also offer you some reassurance that your breast health is good. One way to learn what is normal for your breasts and whether your breasts are changing is to do a breast self-exam. If you find a lump or something that was not present in the past, it is best to contact your caregiver right away. Other findings that should be evaluated by your caregiver include nipple discharge, especially if it is bloody; skin changes or reddening; areas where the skin seems to be pulled in (retracted); or new lumps and bumps. Breast pain is seldom associated with cancer (malignancy), but should also be evaluated by a caregiver. BREAST SELF-EXAM The best time to examine your breasts is 5 7 days after your menstrual period is over.  ExitCare Patient Information 2013 [employer].   Exercise to Stay Healthy Exercise helps you become and stay healthy. EXERCISE IDEAS AND TIPS Choose exercises that:  You enjoy.  Fit into your day. You do not need to exercise really hard to be healthy. You can do exercises at a slow or medium level and stay healthy. You can:  Stretch before and after working out.  Try yoga, Pilates, or tai chi.  Lift weights.  Walk fast, swim, jog, run, climb stairs, bicycle, dance, or rollerskate.  Take aerobic classes. Exercises that burn about 150 calories:  Running 1  miles in 15 minutes.  Playing volleyball for 45 to 60 minutes.  Washing and waxing a car for 45 to 60  minutes.  Playing touch football for 45 minutes.  Walking 1  miles in 35 minutes.  Pushing a stroller 1  miles in 30 minutes.  Playing basketball for 30 minutes.  Raking leaves for 30 minutes.  Bicycling 5 miles in 30 minutes.  Walking 2 miles in 30 minutes.  Dancing for 30 minutes.  Shoveling snow for 15 minutes.  Swimming laps for 20 minutes.  Walking up stairs for 15 minutes.  Bicycling 4 miles in 15 minutes.  Gardening for 30 to 45 minutes.  Jumping rope for 15 minutes.  Washing windows or floors for 45 to 60 minutes. Document Released: 01/22/2010 Document Revised: 03/14/2011 Document Reviewed: 01/22/2010 ExitCare Patient Information 2013 [employer].   Other topics ( that may be useful information):    Sexually Transmitted Disease Sexually transmitted disease (STD) refers to any infection that is passed from person to person during sexual activity. This may happen by way of saliva, semen, blood, vaginal mucus, or urine. Common STDs include:  Gonorrhea.  Chlamydia.  Syphilis.  HIV/AIDS.  Genital herpes.  Hepatitis B and C.  Trichomonas.  Human papillomavirus (HPV).  Pubic lice. CAUSES  An STD may be spread by bacteria, virus, or parasite. A person can get an STD by:  Sexual intercourse with an infected person.  Sharing sex toys with an infected person.  Sharing needles with an infected person.  Having intimate contact with the genitals, mouth, or rectal areas of an infected person. SYMPTOMS  Some people may not have any symptoms, but   they can still pass the infection to others. Different STDs have different symptoms. Symptoms include:  Painful or bloody urination.  Pain in the pelvis, abdomen, vagina, anus, throat, or eyes.  Skin rash, itching, irritation, growths, or sores (lesions). These usually occur in the genital or anal area.  Abnormal vaginal discharge.  Penile discharge in men.  Soft, flesh-colored skin growths in the  genital or anal area.  Fever.  Pain or bleeding during sexual intercourse.  Swollen glands in the groin area.  Yellow skin and eyes (jaundice). This is seen with hepatitis. DIAGNOSIS  To make a diagnosis, your caregiver may:  Take a medical history.  Perform a physical exam.  Take a specimen (culture) to be examined.  Examine a sample of discharge under a microscope.  Perform blood test TREATMENT   Chlamydia, gonorrhea, trichomonas, and syphilis can be cured with antibiotic medicine.  Genital herpes, hepatitis, and HIV can be treated, but not cured, with prescribed medicines. The medicines will lessen the symptoms.  Genital warts from HPV can be treated with medicine or by freezing, burning (electrocautery), or surgery. Warts may come back.  HPV is a virus and cannot be cured with medicine or surgery.However, abnormal areas may be followed very closely by your caregiver and may be removed from the cervix, vagina, or vulva through office procedures or surgery. If your diagnosis is confirmed, your recent sexual partners need treatment. This is true even if they are symptom-free or have a negative culture or evaluation. They should not have sex until their caregiver says it is okay. HOME CARE INSTRUCTIONS  All sexual partners should be informed, tested, and treated for all STDs.  Take your antibiotics as directed. Finish them even if you start to feel better.  Only take over-the-counter or prescription medicines for pain, discomfort, or fever as directed by your caregiver.  Rest.  Eat a balanced diet and drink enough fluids to keep your urine clear or pale yellow.  Do not have sex until treatment is completed and you have followed up with your caregiver. STDs should be checked after treatment.  Keep all follow-up appointments, Pap tests, and blood tests as directed by your caregiver.  Only use latex condoms and water-soluble lubricants during sexual activity. Do not use  petroleum jelly or oils.  Avoid alcohol and illegal drugs.  Get vaccinated for HPV and hepatitis. If you have not received these vaccines in the past, talk to your caregiver about whether one or both might be right for you.  Avoid risky sex practices that can break the skin. The only way to avoid getting an STD is to avoid all sexual activity.Latex condoms and dental dams (for oral sex) will help lessen the risk of getting an STD, but will not completely eliminate the risk. SEEK MEDICAL CARE IF:   You have a fever.  You have any new or worsening symptoms. Document Released: 03/12/2002 Document Revised: 03/14/2011 Document Reviewed: 03/19/2010 [facility] Patient Information 2013 [place].    Domestic Abuse You are being battered or abused if someone close to you hits, pushes, or physically hurts you in any way. You also are being abused if you are forced into activities. You are being sexually abused if you are forced to have sexual contact of any kind. You are being emotionally abused if you are made to feel worthless or if you are constantly threatened. It is important to remember that help is available. No one has the right to abuse you. PREVENTION OF FURTHER  ABUSE  Learn the warning signs of danger. This varies with situations but may include: the use of alcohol, threats, isolation from friends and family, or forced sexual contact. Leave if you feel that violence is going to occur.  If you are attacked or beaten, report it to the police so the abuse is documented. You do not have to press charges. The police can protect you while you or the attackers are leaving. Get the officer's name and badge number and a copy of the report.  Find someone you can trust and tell them what is happening to you: your caregiver, a nurse, clergy member, close friend or family member. Feeling ashamed is natural, but remember that you have done nothing wrong. No one deserves abuse. Document Released:  12/18/1999 Document Revised: 03/14/2011 Document Reviewed: 02/25/2010 ExitCare Patient Information 2013 [employer].    How Much is Too Much Alcohol? Drinking too much alcohol can cause injury, accidents, and health problems. These types of problems can include:   Car crashes.  Falls.  Family fighting (domestic violence).  Drowning.  Fights.  Injuries.  Burns.  Damage to certain organs.  Having a baby with birth defects. ONE DRINK CAN BE TOO MUCH WHEN YOU ARE:  Working.  Pregnant or breastfeeding.  Taking medicines. Ask your doctor.  Driving or planning to drive. If you or someone you know has a drinking problem, get help from a doctor.  Document Released: 10/16/2008 Document Revised: 03/14/2011 Document Reviewed: 10/16/2008 ExitCare Patient Information 2013 [employer].   Smoking Hazards Smoking cigarettes is extremely bad for your health. Tobacco smoke has over 200 known poisons in it. There are over 60 chemicals in tobacco smoke that cause cancer. Some of the chemicals found in cigarette smoke include:   Cyanide.  Benzene.  Formaldehyde.  Methanol (wood alcohol).  Acetylene (fuel used in welding torches).  Ammonia. Cigarette smoke also contains the poisonous gases nitrogen oxide and carbon monoxide.  Cigarette smokers have an increased risk of many serious medical problems and Smoking causes approximately:  90% of all lung cancer deaths in men.  80% of all lung cancer deaths in women.  90% of deaths from chronic obstructive lung disease. Compared with nonsmokers, smoking increases the risk of:  Coronary heart disease by 2 to 4 times.  Stroke by 2 to 4 times.  Men developing lung cancer by 23 times.  Women developing lung cancer by 13 times.  Dying from chronic obstructive lung diseases by 12 times.  . Smoking is the most preventable cause of death and disease in our society.  WHY IS SMOKING ADDICTIVE?  Nicotine is the chemical  agent in tobacco that is capable of causing addiction or dependence.  When you smoke and inhale, nicotine is absorbed rapidly into the bloodstream through your lungs. Nicotine absorbed through the lungs is capable of creating a powerful addiction. Both inhaled and non-inhaled nicotine may be addictive.  Addiction studies of cigarettes and spit tobacco show that addiction to nicotine occurs mainly during the teen years, when young people begin using tobacco products. WHAT ARE THE BENEFITS OF QUITTING?  There are many health benefits to quitting smoking.   Likelihood of developing cancer and heart disease decreases. Health improvements are seen almost immediately.  Blood pressure, pulse rate, and breathing patterns start returning to normal soon after quitting. QUITTING SMOKING   American Lung Association - 1-800-LUNGUSA  American Cancer Society - 1-800-ACS-2345 Document Released: 01/28/2004 Document Revised: 03/14/2011 Document Reviewed: 10/01/2008 ExitCare Patient Information 2013 ExitCare,   LLC.   Stress Management Stress is a state of physical or mental tension that often results from changes in your life or normal routine. Some common causes of stress are:  Death of a loved one.  Injuries or severe illnesses.  Getting fired or changing jobs.  Moving into a new home. Other causes may be:  Sexual problems.  Business or financial losses.  Taking on a large debt.  Regular conflict with someone at home or at work.  Constant tiredness from lack of sleep. It is not just bad things that are stressful. It may be stressful to:  Win the lottery.  Get married.  Buy a new car. The amount of stress that can be easily tolerated varies from person to person. Changes generally cause stress, regardless of the types of change. Too much stress can affect your health. It may lead to physical or emotional problems. Too little stress (boredom) may also become stressful. SUGGESTIONS TO  REDUCE STRESS:  Talk things over with your family and friends. It often is helpful to share your concerns and worries. If you feel your problem is serious, you may want to get help from a professional counselor.  Consider your problems one at a time instead of lumping them all together. Trying to take care of everything at once may seem impossible. List all the things you need to do and then start with the most important one. Set a goal to accomplish 2 or 3 things each day. If you expect to do too many in a single day you will naturally fail, causing you to feel even more stressed.  Do not use alcohol or drugs to relieve stress. Although you may feel better for a short time, they do not remove the problems that caused the stress. They can also be habit forming.  Exercise regularly - at least 3 times per week. Physical exercise can help to relieve that "uptight" feeling and will relax you.  The shortest distance between despair and hope is often a good night's sleep.  Go to bed and get up on time allowing yourself time for appointments without being rushed.  Take a short "time-out" period from any stressful situation that occurs during the day. Close your eyes and take some deep breaths. Starting with the muscles in your face, tense them, hold it for a few seconds, then relax. Repeat this with the muscles in your neck, shoulders, hand, stomach, back and legs.  Take good care of yourself. Eat a balanced diet and get plenty of rest.  Schedule time for having fun. Take a break from your daily routine to relax. HOME CARE INSTRUCTIONS   Call if you feel overwhelmed by your problems and feel you can no longer manage them on your own.  Return immediately if you feel like hurting yourself or someone else. Document Released: 06/15/2000 Document Revised: 03/14/2011 Document Reviewed: 02/05/2007 [facility] Patient Information 2013 [place].  Exercising to Lose Weight Exercising can help you to  lose weight. In order to lose weight through exercise, you need to do vigorous-intensity exercise. You can tell that you are exercising with vigorous intensity if you are breathing very hard and fast and cannot hold a conversation while exercising. Moderate-intensity exercise helps to maintain your current weight. You can tell that you are exercising at a moderate level if you have a higher heart rate and faster breathing, but you are still able to hold a conversation. HOW OFTEN SHOULD I EXERCISE? Choose an activity that you  enjoy and set realistic goals. Your health care provider can help you to make an activity plan that works for you. Exercise regularly as directed by your health care provider. This may include:  Doing resistance training twice each week, such as:  Push-ups.  Sit-ups.  Lifting weights.  Using resistance bands.  Doing a given intensity of exercise for a given amount of time. Choose from these options:  150 minutes of moderate-intensity exercise every week.  75 minutes of vigorous-intensity exercise every week.  A mix of moderate-intensity and vigorous-intensity exercise every week. Children, pregnant women, people who are out of shape, people who are overweight, and older adults may need to consult a health care provider for individual recommendations. If you have any sort of medical condition, be sure to consult your health care provider before starting a new exercise program. WHAT ARE SOME ACTIVITIES THAT CAN HELP ME TO LOSE WEIGHT?   Walking at a rate of at least 4.5 miles an hour.  Jogging or running at a rate of 5 miles per hour.  Biking at a rate of at least 10 miles per hour.  Lap swimming.  Roller-skating or in-line skating.  Cross-country skiing.  Vigorous competitive sports, such as football, basketball, and soccer.  Jumping rope.  Aerobic dancing. HOW CAN I BE MORE ACTIVE IN MY DAY-TO-DAY ACTIVITIES?  Use the stairs instead of the  elevator.  Take a walk during your lunch break.  If you drive, park your car farther away from work or school.  If you take public transportation, get off one stop early and walk the rest of the way.  Make all of your phone calls while standing up and walking around.  Get up, stretch, and walk around every 30 minutes throughout the day. WHAT GUIDELINES SHOULD I FOLLOW WHILE EXERCISING?  Do not exercise so much that you hurt yourself, feel dizzy, or get very short of breath.  Consult your health care provider prior to starting a new exercise program.  Wear comfortable clothes and shoes with good support.  Drink plenty of water while you exercise to prevent dehydration or heat stroke. Body water is lost during exercise and must be replaced.  Work out until you breathe faster and your heart beats faster.   This information is not intended to replace advice given to you by your health care provider. Make sure you discuss any questions you have with your health care provider.   Document Released: 01/22/2010 Document Revised: 01/10/2014 Document Reviewed: 05/23/2013 Elsevier Interactive Patient Education 2016 [place] for [employer] Calories are energy you get from the things you eat and drink. Your body uses this energy to keep you going throughout the day. The number of calories you eat affects your weight. When you eat more calories than your body needs, your body stores the extra calories as fat. When you eat fewer calories than your body needs, your body burns fat to get the energy it needs. Calorie counting means keeping track of how many calories you eat and drink each day. If you make sure to eat fewer calories than your body needs, you should lose weight. In order for calorie counting to work, you will need to eat the number of calories that are right for you in a day to lose a healthy amount of weight per week. A healthy amount of weight to lose per week is  usually 1-2 lb (0.5-0.9 kg). A dietitian can determine how many calories you need in  a day and give you suggestions on how to reach your calorie goal.  WHAT IS MY MY PLAN? My goal is to have __________ calories per day.  If I have this many calories per day, I should lose around __________ pounds per week. WHAT DO I NEED TO KNOW ABOUT CALORIE COUNTING? In order to meet your daily calorie goal, you will need to:  Find out how many calories are in each food you would like to eat. Try to do this before you eat.  Decide how much of the food you can eat.  Write down what you ate and how many calories it had. Doing this is called keeping a food log. WHERE DO I FIND CALORIE INFORMATION? The number of calories in a food can be found on a Nutrition Facts label. Note that all the information on a label is based on a specific serving of the food. If a food does not have a Nutrition Facts label, try to look up the calories online or ask your dietitian for help. HOW DO I DECIDE HOW MUCH TO EAT? To decide how much of the food you can eat, you will need to consider both the number of calories in one serving and the size of one serving. This information can be found on the Nutrition Facts label. If a food does not have a Nutrition Facts label, look up the information online or ask your dietitian for help. Remember that calories are listed per serving. If you choose to have more than one serving of a food, you will have to multiply the calories per serving by the amount of servings you plan to eat. For example, the label on a package of bread might say that a serving size is 1 slice and that there are 90 calories in a serving. If you eat 1 slice, you will have eaten 90 calories. If you eat 2 slices, you will have eaten 180 calories. HOW DO I KEEP A FOOD LOG? After each meal, record the following information in your food log:  What you ate.  How much of it you ate.  How many calories it had.  Then, add up  your calories. Keep your food log near you, such as in a small notebook in your pocket. Another option is to use a mobile app or website. Some programs will calculate calories for you and show you how many calories you have left each time you add an item to the log. WHAT ARE SOME CALORIE COUNTING TIPS?  Use your calories on foods and drinks that will fill you up and not leave you hungry. Some examples of this include foods like nuts and nut butters, vegetables, lean proteins, and high-fiber foods (more than 5 g fiber per serving).  Eat nutritious foods and avoid empty calories. Empty calories are calories you get from foods or beverages that do not have many nutrients, such as candy and soda. It is better to have a nutritious high-calorie food (such as an avocado) than a food with few nutrients (such as a bag of chips).  Know how many calories are in the foods you eat most often. This way, you do not have to look up how many calories they have each time you eat them.  Look out for foods that may seem like low-calorie foods but are really high-calorie foods, such as baked goods, soda, and fat-free candy.  Pay attention to calories in drinks. Drinks such as sodas, specialty coffee drinks, alcohol, and juices  have a lot of calories yet do not fill you up. Choose low-calorie drinks like water and diet drinks.  Focus your calorie counting efforts on higher calorie items. Logging the calories in a garden salad that contains only vegetables is less important than calculating the calories in a milk shake.  Find a way of tracking calories that works for you. Get creative. Most people who are successful find ways to keep track of how much they eat in a day, even if they do not count every calorie. WHAT ARE SOME PORTION CONTROL TIPS?  Know how many calories are in a serving. This will help you know how many servings of a certain food you can have.  Use a measuring cup to measure serving sizes. This is  helpful when you start out. With time, you will be able to estimate serving sizes for some foods.  Take some time to put servings of different foods on your favorite plates, bowls, and cups so you know what a serving looks like.  Try not to eat straight from a bag or box. Doing this can lead to overeating. Put the amount you would like to eat in a cup or on a plate to make sure you are eating the right portion.  Use smaller plates, glasses, and bowls to prevent overeating. This is a quick and easy way to practice portion control. If your plate is smaller, less food can fit on it.  Try not to multitask while eating, such as watching TV or using your computer. If it is time to eat, sit down at a table and enjoy your food. Doing this will help you to start recognizing when you are full. It will also make you more aware of what and how much you are eating. HOW CAN I CALORIE COUNT WHEN EATING OUT?  Ask for smaller portion sizes or child-sized portions.  Consider sharing an entree and sides instead of getting your own entree.  If you get your own entree, eat only half. Ask for a box at the beginning of your meal and put the rest of your entree in it so you are not tempted to eat it.  Look for the calories on the menu. If calories are listed, choose the lower calorie options.  Choose dishes that include vegetables, fruits, whole grains, low-fat dairy products, and lean protein. Focusing on smart food choices from each of the 5 food groups can help you stay on track at restaurants.  Choose items that are boiled, broiled, grilled, or steamed.  Choose water, milk, unsweetened iced tea, or other drinks without added sugars. If you want an alcoholic beverage, choose a lower calorie option. For example, a regular margarita can have up to 700 calories and a glass of wine has around 150.  Stay away from items that are buttered, battered, fried, or served with cream sauce. Items labeled "crispy" are usually  fried, unless stated otherwise.  Ask for dressings, sauces, and syrups on the side. These are usually very high in calories, so do not eat much of them.  Watch out for salads. Many people think salads are a healthy option, but this is often not the case. Many salads come with bacon, fried chicken, lots of cheese, fried chips, and dressing. All of these items have a lot of calories. If you want a salad, choose a garden salad and ask for grilled meats or steak. Ask for the dressing on the side, or ask for olive oil and vinegar or  lemon to use as dressing.  Estimate how many servings of a food you are given. For example, a serving of cooked rice is  cup or about the size of half a tennis ball or one cupcake wrapper. Knowing serving sizes will help you be aware of how much food you are eating at restaurants. The list below tells you how big or small some common portion sizes are based on everyday objects.  1 oz--4 stacked dice.  3 oz--1 deck of cards.  1 tsp--1 dice.  1 Tbsp-- a Ping-Pong ball.  2 Tbsp--1 Ping-Pong ball.   cup--1 tennis ball or 1 cupcake wrapper.  1 cup--1 baseball.   This information is not intended to replace advice given to you by your health care provider. Make sure you discuss any questions you have with your health care provider.   Document Released: 12/20/2004 Document Revised: 01/10/2014 Document Reviewed: 10/25/2012 Elsevier Interactive Patient Education [employer].

## 2014-11-13 NOTE — Progress Notes (Signed)
24 y.o. G0P0000 Single  African American Fe here for annual exam. Periods normal , no issues. Partner change, had STD screening here 7/16, none needed today. Contraception working well. Mood swings stopped with Nexplanon removal. 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 Exercises once weekly only. Sees PCP prn. No other health issues today.  Patient's last menstrual period was 10/22/2014.          Sexually active: Yes.    The current method of family planning is condoms all the time.    Exercising: Yes.    cardio,yoga,weights Smoker:  no  Health Maintenance: Pap:  11-07-13 neg MMG:  none Colonoscopy:  none BMD:   none TDaP:  2009 per patient Labs: hgb-13.6 Self breast exam: done occ   reports that she has never smoked. She has never used smokeless tobacco. She reports that she drinks about 0.6 oz of alcohol per week. She reports that she does not use illicit drugs.  Past Medical History  Diagnosis Date  . Asthma     as a child  . STD (sexually transmitted disease)     + chlamydia 3/16    Past Surgical History  Procedure Laterality Date  . Tonsillectomy    . Wisdom tooth extraction      Current Outpatient Prescriptions  Medication Sig Dispense Refill  . loratadine (CLARITIN) 10 MG tablet Take 10 mg by mouth.     No current facility-administered medications for this visit.    Family History  Problem Relation Age of Onset  . Hypertension Father   . Lung cancer Maternal Grandmother   . Lung cancer Paternal Grandmother     ROS:  Pertinent items are noted in HPI.  Otherwise, a comprehensive ROS was negative.  Exam:   BP 110/78 mmHg  Pulse 70  Resp 16  Ht 5' 3.25" (1.607 m)  Wt 273 lb (123.832 kg)  BMI 47.95 kg/m2  LMP 10/22/2014 Height: 5' 3.25" (160.7 cm) Ht Readings from Last 3 Encounters:  11/13/14 5' 3.25" (1.607 m)  07/08/14 5' 2.75" (1.594 m)  05/21/14 5' 2.75" (1.594 m)    General appearance:  alert, cooperative and appears stated age Head: Normocephalic, without obvious abnormality, atraumatic Neck: no adenopathy, supple, symmetrical, trachea midline and thyroid normal to inspection and palpationnormal to inspection and palpation Lungs: clear to auscultation bilaterally Breasts: normal appearance, no masses or tenderness, No nipple retraction or dimpling, No nipple discharge or bleeding, No axillary or supraclavicular adenopathy Heart: regular rate and rhythm Abdomen: soft, non-tender; no masses,  no organomegaly Extremities: extremities normal, atraumatic, no cyanosis or edema Skin: Skin color, texture, turgor normal. No rashes or lesions Lymph nodes: Cervical, supraclavicular, and axillary nodes normal. No abnormal inguinal nodes palpated Neurologic: Grossly normal   Pelvic: External genitalia:  no lesions              Urethra:  normal appearing urethra with no masses, tenderness or lesions              Bartholin's and Skene's: normal                 Vagina: normal appearing vagina with normal color and discharge, no lesions              Cervix: normal,non tender, no lesions              Pap taken: No. Bimanual Exam:  Uterus:  normal size, contour,  position, consistency, mobility, non-tender, limited with body habitus              Adnexa: normal adnexa and no mass, fullness, tenderness, limited with body habitus               Rectovaginal: Confirms               Anus:  Normal appearance  Chaperone present: yes  A:  Well Woman with normal exam  Contraception consistent condoms  Obesity  P:   Reviewed health and wellness pertinent to exam  Stressed importance of consistent use  Discussed obesity and increase risk of hypertension, diabetes, and joint issues with added weight. Discussed portion control, food selection and regular exercise. 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 Encouraged to walk during break part of the day and stretch. Avoid sweets, such as  candy etc. Work on eating largest meal in am and less as the day progresses. Aware of weight loss options such as weight watchers for assistance.  Pap smear as above not taken   counseled on breast self exam, STD prevention, HIV risk factors and prevention, adequate intake of calcium and vitamin D, diet and exercise  return annually or prn  An After Visit Summary was printed and given to the patient.

## 2014-11-13 NOTE — Progress Notes (Signed)
Reviewed personally.  M. Suzanne Hao Dion, MD.  

## 2015-02-07 ENCOUNTER — Emergency Department (HOSPITAL_BASED_OUTPATIENT_CLINIC_OR_DEPARTMENT_OTHER)
Admission: EM | Admit: 2015-02-07 | Discharge: 2015-02-07 | Disposition: A | Discharge status: Home / Self Care | Payer: PRIVATE HEALTH INSURANCE | Attending: Physician Assistant | Admitting: Physician Assistant

## 2015-02-07 ENCOUNTER — Encounter (HOSPITAL_BASED_OUTPATIENT_CLINIC_OR_DEPARTMENT_OTHER): Payer: Self-pay | Admitting: Emergency Medicine

## 2015-02-07 DIAGNOSIS — Z8619 Personal history of other infectious and parasitic diseases: Secondary | ICD-10-CM | POA: Diagnosis not present

## 2015-02-07 DIAGNOSIS — J45909 Unspecified asthma, uncomplicated: Secondary | ICD-10-CM | POA: Diagnosis not present

## 2015-02-07 DIAGNOSIS — J028 Acute pharyngitis due to other specified organisms: Secondary | ICD-10-CM

## 2015-02-07 DIAGNOSIS — B9789 Other viral agents as the cause of diseases classified elsewhere: Secondary | ICD-10-CM

## 2015-02-07 DIAGNOSIS — M791 Myalgia: Secondary | ICD-10-CM | POA: Insufficient documentation

## 2015-02-07 DIAGNOSIS — J029 Acute pharyngitis, unspecified: Secondary | ICD-10-CM | POA: Diagnosis present

## 2015-02-07 LAB — RAPID STREP SCREEN (MED CTR MEBANE ONLY): Streptococcus, Group A Screen (Direct): NEGATIVE

## 2015-02-07 MED ORDER — DEXAMETHASONE 6 MG PO TABS
10.0000 mg | ORAL_TABLET | Freq: Once | ORAL | Status: AC
  Administered 2015-02-07: 10 mg via ORAL
  Filled 2015-02-07: qty 1

## 2015-02-07 MED ORDER — IBUPROFEN 400 MG PO TABS
600.0000 mg | ORAL_TABLET | Freq: Once | ORAL | Status: AC
  Administered 2015-02-07: 600 mg via ORAL
  Filled 2015-02-07: qty 1

## 2015-02-07 NOTE — ED Notes (Signed)
Pt with tonsils and adenoids removed.

## 2015-02-07 NOTE — ED Notes (Signed)
Pt in c/o sore throat onset 2 days ago. Airway intact, breathing even and unlabored in NAD.

## 2015-02-07 NOTE — ED Notes (Signed)
C/o sore throat, ear ache onset 1day prior to sore throat (resolved), (denies: fever, nvd, dizziness, bleeding, sob or other sx), no meds PTA. Family at [facility]. Throat with minimal swelling, no obvious redness or exudate.

## 2015-02-07 NOTE — Discharge Instructions (Signed)
Your strep screen is negative. We have sent it for culture. If it comes back positive someone will call you and start an antibiotic.  Take tylenol and ibuprofen for pain. Use Chloraseptic spray and salt water gargles. Follow up with your doctor or return here for any problems.   Pharyngitis Pharyngitis is redness, pain, and swelling (inflammation) of your pharynx.  CAUSES  Pharyngitis is usually caused by infection. Most of the time, these infections are from viruses (viral) and are part of a cold. However, sometimes pharyngitis is caused by bacteria (bacterial). Pharyngitis can also be caused by allergies. Viral pharyngitis may be spread from person to person by coughing, sneezing, and personal items or utensils (cups, forks, spoons, toothbrushes). Bacterial pharyngitis may be spread from person to person by more intimate contact, such as kissing.  SIGNS AND SYMPTOMS  Symptoms of pharyngitis include:   Sore throat.   Tiredness (fatigue).   Low-grade fever.   Headache.  Joint pain and muscle aches.  Skin rashes.  Swollen lymph nodes.  Plaque-like film on throat or tonsils (often seen with bacterial pharyngitis). DIAGNOSIS  Your health care provider will ask you questions about your illness and your symptoms. Your medical history, along with a physical exam, is often all that is needed to diagnose pharyngitis. Sometimes, a rapid strep test is done. Other lab tests may also be done, depending on the suspected cause.  TREATMENT  Viral pharyngitis will usually get better in 3-4 days without the use of medicine. Bacterial pharyngitis is treated with medicines that kill germs (antibiotics).  HOME CARE INSTRUCTIONS   Drink enough water and fluids to keep your urine clear or pale yellow.   Only take over-the-counter or prescription medicines as directed by your health care provider:   If you are prescribed antibiotics, make sure you finish them even if you start to feel better.    Do not take aspirin.   Get lots of rest.   Gargle with 8 oz of salt water ( tsp of salt per 1 qt of water) as often as every 1-2 hours to soothe your throat.   Throat lozenges (if you are not at risk for choking) or sprays may be used to soothe your throat. SEEK MEDICAL CARE IF:   You have large, tender lumps in your neck.  You have a rash.  You cough up green, yellow-brown, or bloody spit. SEEK IMMEDIATE MEDICAL CARE IF:   Your neck becomes stiff.  You drool or are unable to swallow liquids.  You vomit or are unable to keep medicines or liquids down.  You have severe pain that does not go away with the use of recommended medicines.  You have trouble breathing (not caused by a stuffy nose). MAKE SURE YOU:   Understand these instructions.  Will watch your condition.  Will get help right away if you are not doing well or get worse.   This information is not intended to replace advice given to you by your health care provider. Make sure you discuss any questions you have with your health care provider.   Document Released: 12/20/2004 Document Revised: 10/10/2012 Document Reviewed: 08/27/2012 Elsevier Interactive Patient Education [employer]

## 2015-02-07 NOTE — ED Provider Notes (Signed)
CSN: [id number]     Arrival date & time 02/07/15  2215 History   First MD Initiated Contact with Patient 02/07/15 2236     Chief Complaint  Patient presents with  . Sore Throat     (Consider location/radiation/quality/duration/timing/severity/associated sxs/prior Treatment) Patient is a 26 y.o. female presenting with pharyngitis. The history is provided by the patient.  Sore Throat This is a new problem. The current episode started yesterday. The problem occurs constantly. The problem has been gradually worsening. Associated symptoms include myalgias and a sore throat. Pertinent negatives include no chills, coughing, fever, swollen glands or vomiting. She has tried NSAIDs for the symptoms. The treatment provided mild relief.   ENIS LEATHERWOOD is a 25 y.o. female who presents to the ED with a sore throat that started yesterday. She denies fever, chills or other symptoms. She has taken OTC medication that does help some. She last took ibuprofen this morning.   Past Medical History  Diagnosis Date  . Asthma     as a child  . STD (sexually transmitted disease)     + chlamydia 3/16   Past Surgical History  Procedure Laterality Date  . Tonsillectomy    . Wisdom tooth extraction     Family History  Problem Relation Age of Onset  . Hypertension Father   . Lung cancer Maternal Grandmother   . Lung cancer Paternal Grandmother    Social History  Substance Use Topics  . Smoking status: Never Smoker   . Smokeless tobacco: Never Used  . Alcohol Use: 0.6 oz/week    1 Standard drinks or equivalent per week   OB History    Gravida Para Term Preterm AB TAB SAB Ectopic Multiple Living       Review of Systems  Constitutional: Negative for fever and chills.  HENT: Positive for sore throat.   Respiratory: Negative for cough.   Gastrointestinal: Negative for vomiting.  Musculoskeletal: Positive for myalgias.  all other systems negative    Allergies  Review of  patient's allergies indicates no known allergies.  Home Medications   Prior to Admission medications   Medication Sig Start Date End Date Taking? Authorizing Provider  loratadine (CLARITIN) 10 MG tablet Take 10 mg by mouth. 07/26/13 07/26/14  Historical Provider, MD   BP 129/89 mmHg  Pulse 74  Temp(Src) 97.8 F (36.6 C) (Oral)  Resp 18  Ht  (1.6 m)  Wt 108.863 kg  BMI 42.52 kg/m2  SpO2 99% Physical Exam  Constitutional: She is oriented to person, place, and time. She appears well-developed and well-nourished. No distress.  HENT:  Head: Normocephalic and atraumatic.  Right Ear: Tympanic membrane normal.  Left Ear: Tympanic membrane normal.  Nose: Nose normal.  Mouth/Throat: Uvula is midline and mucous membranes are normal. Posterior oropharyngeal erythema present.  Eyes: EOM are normal.  Neck: Neck supple.  Cardiovascular: Normal rate and regular rhythm.   Pulmonary/Chest: Effort normal and breath sounds normal. She has no wheezes. She has no rales.  Abdominal: Soft. Bowel sounds are normal. There is no tenderness.  Musculoskeletal: Normal range of motion.  Lymphadenopathy:    She has no cervical adenopathy.  Neurological: She is alert and oriented to person, place, and time. No cranial nerve deficit.  Skin: Skin is warm and dry.  Psychiatric: She has a normal mood and affect. Her behavior is normal.  Nursing note and vitals reviewed.   ED Course  Procedures  Results for orders placed or performed during the hospital encounter of 02/07/15 (from the past 24 hour(s))  Rapid strep screen     Status: None   Collection Time: 02/07/15 11:00 PM  Result Value Ref Range   Streptococcus, Group A Screen (Direct) NEGATIVE NEGATIVE     MDM  25 y.o. female with sore throat that started yesterday stable for d/c without fever, no difficulty swallowing and does not appear toxic. Will treat as viral illness. Discussed with the patient and all questioned fully answered. She will  return if any problems arise.   Final diagnoses:  Sore throat (viral)       Janne Napoleon, NP 02/08/15 0051  Courteney Randall An, MD 02/08/15 2255

## 2015-02-10 LAB — CULTURE, GROUP A STREP (THRC)

## 2015-08-12 ENCOUNTER — Ambulatory Visit (INDEPENDENT_AMBULATORY_CARE_PROVIDER_SITE_OTHER): Payer: PRIVATE HEALTH INSURANCE

## 2015-08-12 ENCOUNTER — Ambulatory Visit (INDEPENDENT_AMBULATORY_CARE_PROVIDER_SITE_OTHER): Payer: PRIVATE HEALTH INSURANCE | Admitting: Diagnostic Neuroimaging

## 2015-08-12 ENCOUNTER — Encounter: Payer: Self-pay | Admitting: Diagnostic Neuroimaging

## 2015-08-12 VITALS — BP 125/83 | HR 84 | Ht 62.75 in | Wt 273.2 lb

## 2015-08-12 DIAGNOSIS — H4710 Unspecified papilledema: Secondary | ICD-10-CM | POA: Diagnosis not present

## 2015-08-12 DIAGNOSIS — R519 Headache, unspecified: Secondary | ICD-10-CM

## 2015-08-12 DIAGNOSIS — R51 Headache: Secondary | ICD-10-CM

## 2015-08-12 MED ORDER — TOPIRAMATE 50 MG PO TABS: 50.0000 mg | ORAL_TABLET | Freq: Two times a day (BID) | ORAL | 12 refills | Status: DC

## 2015-08-12 NOTE — Patient Instructions (Signed)
Thank you for coming to see [place] at [facility]. I hope we have been able to provide you high quality care today.  You may receive a patient satisfaction survey over the next few weeks. We would appreciate your feedback and comments so that we may continue to improve ourselves and the health of our patients.   - check MRI brain - start topiramate 57m at bedtime; increase to twice a day after 1-2 weeks (drink plenty of water)   ~~~~~~~~~~~~~~~~~~~~~~~~~~~~~~~~~~~~~~~~~~~~~~~~~~~~~~~~~~~~~~~~~  DR. Belkys Henault'S GUIDE TO HAPPY AND HEALTHY LIVING These are some of my general health and wellness recommendations. Some of them may apply to you better than others. Please use common sense as you try these suggestions and feel free to ask me any questions.   ACTIVITY/FITNESS Mental, social, emotional and physical stimulation are very important for brain and body health. Try learning a new activity (arts, music, language, sports, games).  Keep moving your body to the best of your abilities. You can do this at home, inside or outside, the park, community center, gym or anywhere you like. Consider a physical therapist or personal trainer to get started. Consider the app Sworkit. Fitness trackers such as smart-watches, smart-phones or Fitbits can help as well.   NUTRITION Eat more plants: colorful vegetables, nuts, seeds and berries.  Eat less sugar, salt, preservatives and processed foods.  Avoid toxins such as cigarettes and alcohol.  Drink water when you are thirsty. Warm water with a slice of lemon is an excellent morning drink to start the day.  Consider these websites for more information The Nutrition Source (hhttps://www.henry-hernandez.biz/ [employer] Nutrition (wWindowBlog.ch   RELAXATION Consider practicing mindfulness meditation or other relaxation techniques such as deep breathing, prayer, yoga, tai chi, massage. See  website mindful.org or the apps Headspace or Calm to help get started.   SLEEP Try to get at least 7-8+ hours sleep per day. Regular exercise and reduced caffeine will help you sleep better. Practice good sleep hygeine techniques. See website sleep.org for more information.   PLANNING Prepare estate planning, living will, healthcare POA documents. Sometimes this is best planned with the help of an attorney. Theconversationproject.org and agingwithdignity.org are excellent resources.

## 2015-08-12 NOTE — Progress Notes (Signed)
[facility]  PATIENT: Anita Patton DOB: 05/03/1990  REFERRING CLINICIAN: Allene Pyo, OD HISTORY FROM: patient and mother (referral notes reviewed) REASON FOR VISIT: new consult    HISTORICAL  CHIEF COMPLAINT:  Chief Complaint  Patient presents with  . Eye Problem    rm 7, mom- Alissa, "eye dr found papilledema during routine exam"    HISTORY OF PRESENT ILLNESS:   25 year old right-handed female here for evaluation of papilledema. Patient went to optometrist for routine eye examination, on 07/21/15. Patient was found to have mild optic nerve head elevation and mild papilledema. In retrospect 2-3 weeks prior to eye exam patient was having increasing generalized headaches, behind the eyes pressure sensation, blurred vision. Right eye was slightly more affected than left. These would occur on every other day basis. Over time they have actually reduce. She denies any transient visual obscuration or tinnitus. No problems with slurred speech, trouble talking, extremity numbness or weakness. Patient has had 30 pound weight gain over the past 1-2 years.  Patient referred to me for further evaluation of possible pseudotumor cerebri.   REVIEW OF SYSTEMS: Full 14 system review of systems performed and negative with exception of: Blurred vision eye pain dizziness anxiety insomnia.  ALLERGIES: No Known Allergies  HOME MEDICATIONS: Outpatient Medications Prior to Visit  Medication Sig Dispense Refill  . loratadine (CLARITIN) 10 MG tablet Take 10 mg by mouth.     No facility-administered medications prior to visit.     PAST MEDICAL HISTORY: Past Medical History:  Diagnosis Date  . Asthma    as a child  . STD (sexually transmitted disease)    + chlamydia 3/16    PAST SURGICAL HISTORY: Past Surgical History:  Procedure Laterality Date  . TONSILLECTOMY  2004  . WISDOM TOOTH EXTRACTION      FAMILY HISTORY: Family History  Problem Relation Age of Onset  .  Hypertension Father   . Lung cancer Maternal Grandmother   . Lung cancer Paternal Grandmother   . Alzheimer's disease Paternal Grandfather     SOCIAL HISTORY:  Social History   Social History  . Marital status: Single    Spouse name: N/A  . Number of children: 0  . Years of education: 12   Occupational History  .      [employer]   Social History Main Topics  . Smoking status: Never Smoker  . Smokeless tobacco: Never Used  . Alcohol use 0.6 oz/week    1 Standard drinks or equivalent per week     Comment: 2-3/month  . Drug use: No  . Sexual activity: Yes    Partners: Male    Birth control/ protection: Condom   Other Topics Concern  . Not on file   Social History Narrative   Lives with roommate   Caffeine- 2 per week     PHYSICAL EXAM  GENERAL EXAM/CONSTITUTIONAL: Vitals:  Vitals:   08/12/15 0928  BP: 125/83  Pulse: 84  Weight: 273 lb 3.2 oz (123.9 kg)  Height: 5' 2.75" (1.594 m)     Body mass index is 48.78 kg/m.  Visual Acuity Screening   Right eye Left eye Both eyes  Without correction:     With correction: 20/20 20/20      Patient is in no distress; well developed, nourished and groomed; neck is supple  CARDIOVASCULAR:  Examination of carotid arteries is normal; no carotid bruits  Regular rate and rhythm, no murmurs  Examination of peripheral vascular system  by observation and palpation is normal  EYES:  Ophthalmoscopic exam of optic discs and posterior segments is NOTABLE FOR SLIGHTLY BLURRED DISC MARGINS; no hemorrhages  MUSCULOSKELETAL:  Gait, strength, tone, movements noted in Neurologic exam below  NEUROLOGIC: MENTAL STATUS:  No flowsheet data found.  awake, alert, oriented to person, place and time  recent and remote memory intact  normal attention and concentration  language fluent, comprehension intact, naming intact,   fund of knowledge appropriate  CRANIAL NERVE:   2nd - fundoscopic exam --> NOTABLE FOR  SLIGHTLY BLURRED DISC MARGINS  2nd, 3rd, 4th, 6th - pupils equal and reactive to light, visual fields full to confrontation, extraocular muscles intact, no nystagmus  5th - facial sensation symmetric  7th - facial strength symmetric  8th - hearing intact  9th - palate elevates symmetrically, uvula midline  11th - shoulder shrug symmetric  12th - tongue protrusion midline  MOTOR:   normal bulk and tone, full strength in the BUE, BLE  SENSORY:   normal and symmetric to light touch, temperature, vibration  COORDINATION:   finger-nose-finger, fine finger movements normal  REFLEXES:   deep tendon reflexes present and symmetric  GAIT/STATION:   narrow based gait; able to walk tandem; romberg is negative    DIAGNOSTIC DATA (LABS, IMAGING, TESTING) - I reviewed patient records, labs, notes, testing and imaging myself where available.  Lab Results  Component Value Date   HGB 13.6 11/13/2014   No results found for: NA, K, CL, CO2, GLUCOSE, BUN, CREATININE, CALCIUM, PROT, ALBUMIN, AST, ALT, ALKPHOS, BILITOT, GFRNONAA, GFRAA No results found for: CHOL, HDL, LDLCALC, LDLDIRECT, TRIG, CHOLHDL No results found for: [id number] No results found for: VITAMINB12 No results found for: TSH     ASSESSMENT AND PLAN  25 y.o. 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 May represent pseudotumor cerebri (idiopathic intracranial hypertension). Will check further workup.   Ddx: Pseudotumor cerebri versus secondary cause (CNS mass, infx, inflamm) versus pseudopapilledema  1. Papilledema   2. New onset headache      PLAN: - check MRI brain - start empiric topiramate 50mg  at bedtime; increase to twice a day after 1-2 weeks - encouraged gradual weight loss, nutrition and activity improvements  Orders Placed This Encounter  Procedures  . MR Brain Wo Contrast   Meds ordered  this encounter  Medications  . topiramate (TOPAMAX) 50 MG tablet    Sig: Take 1 tablet (50 mg total) by mouth 2 (two) times daily.    Dispense:  60 tablet    Refill:  12   Return in about 6 weeks (around 09/23/2015).  I reviewed notes and records myself. I summarized findings and reviewed with patient, for this high risk condition (papilledema; new onset headache) requiring high complexity decision making.    Suanne MarkerVIKRAM R. PENUMALLI, MD 08/12/2015, 10:28 AM Certified in Neurology, Neurophysiology and Neuroimaging  [facility] [address], [address] [place], [place] [id number] [phone]

## 2015-08-18 ENCOUNTER — Telehealth: Payer: Self-pay | Admitting: *Deleted

## 2015-08-18 ENCOUNTER — Encounter: Payer: Self-pay | Admitting: Diagnostic Neuroimaging

## 2015-08-21 NOTE — Telephone Encounter (Signed)
Dr Marjory LiesPenumalli called patient re: my chart message.

## 2015-09-23 ENCOUNTER — Ambulatory Visit: Payer: PRIVATE HEALTH INSURANCE | Admitting: Diagnostic Neuroimaging

## 2015-11-17 ENCOUNTER — Encounter: Payer: Self-pay | Admitting: Certified Nurse Midwife

## 2015-11-17 ENCOUNTER — Ambulatory Visit (INDEPENDENT_AMBULATORY_CARE_PROVIDER_SITE_OTHER): Payer: PRIVATE HEALTH INSURANCE | Admitting: Certified Nurse Midwife

## 2015-11-17 VITALS — BP 118/70 | HR 68 | Resp 16 | Ht 63.0 in | Wt 266.0 lb

## 2015-11-17 DIAGNOSIS — Z0000 Encounter for general adult medical examination without abnormal findings: Secondary | ICD-10-CM

## 2015-11-17 DIAGNOSIS — Z01419 Encounter for gynecological examination (general) (routine) without abnormal findings: Secondary | ICD-10-CM | POA: Diagnosis not present

## 2015-11-17 LAB — POCT URINALYSIS DIPSTICK
BILIRUBIN UA: NEGATIVE
GLUCOSE UA: NEGATIVE
Leukocytes, UA: NEGATIVE
Nitrite, UA: NEGATIVE
Protein, UA: NEGATIVE
Urobilinogen, UA: NEGATIVE
pH, UA: 5

## 2015-11-17 LAB — TSH: TSH: 0.97 mIU/L

## 2015-11-17 LAB — HIV ANTIBODY (ROUTINE TESTING W REFLEX): HIV 1&2 Ab, 4th Generation: NONREACTIVE

## 2015-11-17 NOTE — Progress Notes (Signed)
25 y.o. G0P0000 Single  African American Fe here for annual exam.   Periods some irregularity with no period from February to April, have been normal and regular 28-35 day cycle. Contraception working well.  Sees urgent care if needed. 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 Neurology still following. No other health issues today.  Patient's last menstrual period was 11/16/2015.          Sexually active: Yes.    The current method of family planning is condoms most of the time.    Exercising: Yes.    walking Smoker:  no  Health Maintenance: Pap:  11-07-13 neg MMG:  none Colonoscopy:  none BMD:   none TDaP:  2009 Shingles: no Pneumonia: no Hep C and HIV: HIV neg 2015 Labs: STD screen Self-breast exam: occasionally   reports that she has never smoked. She has never used smokeless tobacco. She reports that she drinks about 0.6 oz of alcohol per week . She reports that she does not use drugs.  Past Medical History:  Diagnosis Date  . Asthma    as a child  . Papilledema    Bilateral  . STD (sexually transmitted disease)    + chlamydia 3/16    Past Surgical History:  Procedure Laterality Date  . TONSILLECTOMY  2004  . WISDOM TOOTH EXTRACTION      Current Outpatient Prescriptions  Medication Sig Dispense Refill  . loratadine (CLARITIN) 10 MG tablet Take 10 mg by mouth daily.    Marland Kitchen. ofloxacin (FLOXIN) 0.3 % otic solution Place 3 drops into the right ear 3 (three) times daily.     No current facility-administered medications for this visit.     Family History  Problem Relation Age of Onset  . Hypertension Father   . Lung cancer Maternal Grandmother   . Lung cancer Paternal Grandmother   . Alzheimer's disease Paternal Grandfather     ROS:  Pertinent items are noted in HPI.  Otherwise, a comprehensive ROS was negative.  Exam:   BP 118/70 (BP Location: Right Arm, Patient Position: Sitting, Cuff Size: Large)   Pulse 68   Resp 16   Ht 5\' 3"  (1.6 m)   Wt  266 lb (120.7 kg)   LMP 11/16/2015   BMI 47.12 kg/m   Height: 5\' 3"  (160 cm) Ht Readings from Last 3 Encounters:  11/17/15 5\' 3"  (1.6 m)  08/12/15 5' 2.75" (1.594 m)  02/07/15 5\' 3"  (1.6 m)    General appearance: alert, cooperative and appears stated age Head: Normocephalic, without obvious abnormality, atraumatic Neck: no adenopathy, supple, symmetrical, trachea midline and thyroid normal to inspection and palpation Lungs: clear to auscultation bilaterally Breasts: normal appearance, no masses or tenderness, No nipple retraction or dimpling, No nipple discharge or bleeding, No axillary or supraclavicular adenopathy Heart: regular rate and rhythm Abdomen: soft, non-tender; no masses,  no organomegaly Extremities: extremities normal, atraumatic, no cyanosis or edema Skin: Skin color, texture, turgor normal. No rashes or lesions Lymph nodes: Cervical, supraclavicular, and axillary nodes normal. No abnormal inguinal nodes palpated Neurologic: Grossly normal   Pelvic: External genitalia:  no lesions              Urethra:  normal appearing urethra with no masses, tenderness or lesions              Bartholin's and Skene's: normal                 Vagina: normal appearing vagina with normal  color and discharge, no lesions, on menses              Cervix: no cervical motion tenderness, no lesions and nulliparous appearance              Pap taken: No. Bimanual Exam:  Uterus:  normal size, contour, position, consistency, mobility, non-tender and limited by body habitus              snormal adnexa, no mass, fullness, tenderness and limited by body habitus               Rectovaginal: Confirms               Anus:  normal sphincter tone, no lesions  Chaperone present: yes  A:  Well Woman with normal exam  Contraception condoms  Period change with amenorrhea which has resolved  Morbid Obesity  Screening labs  History of Papilla edema under Neurology follow up, no medication    P:    Reviewed health and wellness pertinent to exam  Discussed concerns with prolonged time of amenorrhea and need to advise if greater than 3 months. Questions addressed  Discussed health profile will improve with weight loss, patient aware and has been working on at times  Labs: TSH, HIV,RPR, GC,Chlamydia, Affirm  Pap smear as above not taken   counseled on breast self exam, STD prevention, HIV risk factors and prevention, adequate intake of calcium and vitamin D, diet and exercise  return annually or prn  An After Visit Summary was printed and given to the patient.

## 2015-11-17 NOTE — Patient Instructions (Signed)
General topics  Next pap or exam is  due in 1 year Take a Women's multivitamin Take 1200 mg. of calcium daily - prefer dietary If any concerns in interim to call back  Breast Self-Awareness Practicing breast self-awareness may pick up problems early, prevent significant medical complications, and possibly save your life. By practicing breast self-awareness, you can become familiar with how your breasts look and feel and if your breasts are changing. This allows you to notice changes early. It can also offer you some reassurance that your breast health is good. One way to learn what is normal for your breasts and whether your breasts are changing is to do a breast self-exam. If you find a lump or something that was not present in the past, it is best to contact your caregiver right away. Other findings that should be evaluated by your caregiver include nipple discharge, especially if it is bloody; skin changes or reddening; areas where the skin seems to be pulled in (retracted); or new lumps and bumps. Breast pain is seldom associated with cancer (malignancy), but should also be evaluated by a caregiver. BREAST SELF-EXAM The best time to examine your breasts is 5 7 days after your menstrual period is over.  ExitCare Patient Information 2013 [employer].   Exercise to Stay Healthy Exercise helps you become and stay healthy. EXERCISE IDEAS AND TIPS Choose exercises that:  You enjoy.  Fit into your day. You do not need to exercise really hard to be healthy. You can do exercises at a slow or medium level and stay healthy. You can:  Stretch before and after working out.  Try yoga, Pilates, or tai chi.  Lift weights.  Walk fast, swim, jog, run, climb stairs, bicycle, dance, or rollerskate.  Take aerobic classes. Exercises that burn about 150 calories:  Running 1  miles in 15 minutes.  Playing volleyball for 45 to 60 minutes.  Washing and waxing a car for 45 to 60  minutes.  Playing touch football for 45 minutes.  Walking 1  miles in 35 minutes.  Pushing a stroller 1  miles in 30 minutes.  Playing basketball for 30 minutes.  Raking leaves for 30 minutes.  Bicycling 5 miles in 30 minutes.  Walking 2 miles in 30 minutes.  Dancing for 30 minutes.  Shoveling snow for 15 minutes.  Swimming laps for 20 minutes.  Walking up stairs for 15 minutes.  Bicycling 4 miles in 15 minutes.  Gardening for 30 to 45 minutes.  Jumping rope for 15 minutes.  Washing windows or floors for 45 to 60 minutes. Document Released: 01/22/2010 Document Revised: 03/14/2011 Document Reviewed: 01/22/2010 ExitCare Patient Information 2013 [employer].   Other topics ( that may be useful information):    Sexually Transmitted Disease Sexually transmitted disease (STD) refers to any infection that is passed from person to person during sexual activity. This may happen by way of saliva, semen, blood, vaginal mucus, or urine. Common STDs include:  Gonorrhea.  Chlamydia.  Syphilis.  HIV/AIDS.  Genital herpes.  Hepatitis B and C.  Trichomonas.  Human papillomavirus (HPV).  Pubic lice. CAUSES  An STD may be spread by bacteria, virus, or parasite. A person can get an STD by:  Sexual intercourse with an infected person.  Sharing sex toys with an infected person.  Sharing needles with an infected person.  Having intimate contact with the genitals, mouth, or rectal areas of an infected person. SYMPTOMS  Some people may not have any symptoms, but   they can still pass the infection to others. Different STDs have different symptoms. Symptoms include:  Painful or bloody urination.  Pain in the pelvis, abdomen, vagina, anus, throat, or eyes.  Skin rash, itching, irritation, growths, or sores (lesions). These usually occur in the genital or anal area.  Abnormal vaginal discharge.  Penile discharge in men.  Soft, flesh-colored skin growths in the  genital or anal area.  Fever.  Pain or bleeding during sexual intercourse.  Swollen glands in the groin area.  Yellow skin and eyes (jaundice). This is seen with hepatitis. DIAGNOSIS  To make a diagnosis, your caregiver may:  Take a medical history.  Perform a physical exam.  Take a specimen (culture) to be examined.  Examine a sample of discharge under a microscope.  Perform blood test TREATMENT   Chlamydia, gonorrhea, trichomonas, and syphilis can be cured with antibiotic medicine.  Genital herpes, hepatitis, and HIV can be treated, but not cured, with prescribed medicines. The medicines will lessen the symptoms.  Genital warts from HPV can be treated with medicine or by freezing, burning (electrocautery), or surgery. Warts may come back.  HPV is a virus and cannot be cured with medicine or surgery.However, abnormal areas may be followed very closely by your caregiver and may be removed from the cervix, vagina, or vulva through office procedures or surgery. If your diagnosis is confirmed, your recent sexual partners need treatment. This is true even if they are symptom-free or have a negative culture or evaluation. They should not have sex until their caregiver says it is okay. HOME CARE INSTRUCTIONS  All sexual partners should be informed, tested, and treated for all STDs.  Take your antibiotics as directed. Finish them even if you start to feel better.  Only take over-the-counter or prescription medicines for pain, discomfort, or fever as directed by your caregiver.  Rest.  Eat a balanced diet and drink enough fluids to keep your urine clear or pale yellow.  Do not have sex until treatment is completed and you have followed up with your caregiver. STDs should be checked after treatment.  Keep all follow-up appointments, Pap tests, and blood tests as directed by your caregiver.  Only use latex condoms and water-soluble lubricants during sexual activity. Do not use  petroleum jelly or oils.  Avoid alcohol and illegal drugs.  Get vaccinated for HPV and hepatitis. If you have not received these vaccines in the past, talk to your caregiver about whether one or both might be right for you.  Avoid risky sex practices that can break the skin. The only way to avoid getting an STD is to avoid all sexual activity.Latex condoms and dental dams (for oral sex) will help lessen the risk of getting an STD, but will not completely eliminate the risk. SEEK MEDICAL CARE IF:   You have a fever.  You have any new or worsening symptoms. Document Released: 03/12/2002 Document Revised: 03/14/2011 Document Reviewed: 03/19/2010 [facility] Patient Information 2013 [employer].    Domestic Abuse You are being battered or abused if someone close to you hits, pushes, or physically hurts you in any way. You also are being abused if you are forced into activities. You are being sexually abused if you are forced to have sexual contact of any kind. You are being emotionally abused if you are made to feel worthless or if you are constantly threatened. It is important to remember that help is available. No one has the right to abuse you. PREVENTION OF FURTHER  ABUSE  Learn the warning signs of danger. This varies with situations but may include: the use of alcohol, threats, isolation from friends and family, or forced sexual contact. Leave if you feel that violence is going to occur.  If you are attacked or beaten, report it to the police so the abuse is documented. You do not have to press charges. The police can protect you while you or the attackers are leaving. Get the officer's name and badge number and a copy of the report.  Find someone you can trust and tell them what is happening to you: your caregiver, a nurse, clergy member, close friend or family member. Feeling ashamed is natural, but remember that you have done nothing wrong. No one deserves abuse. Document Released:  12/18/1999 Document Revised: 03/14/2011 Document Reviewed: 02/25/2010 ExitCare Patient Information 2013 [employer].    How Much is Too Much Alcohol? Drinking too much alcohol can cause injury, accidents, and health problems. These types of problems can include:   Car crashes.  Falls.  Family fighting (domestic violence).  Drowning.  Fights.  Injuries.  Burns.  Damage to certain organs.  Having a baby with birth defects. ONE DRINK CAN BE TOO MUCH WHEN YOU ARE:  Working.  Pregnant or breastfeeding.  Taking medicines. Ask your doctor.  Driving or planning to drive. If you or someone you know has a drinking problem, get help from a doctor.  Document Released: 10/16/2008 Document Revised: 03/14/2011 Document Reviewed: 10/16/2008 ExitCare Patient Information 2013 [employer].   Smoking Hazards Smoking cigarettes is extremely bad for your health. Tobacco smoke has over 200 known poisons in it. There are over 60 chemicals in tobacco smoke that cause cancer. Some of the chemicals found in cigarette smoke include:   Cyanide.  Benzene.  Formaldehyde.  Methanol (wood alcohol).  Acetylene (fuel used in welding torches).  Ammonia. Cigarette smoke also contains the poisonous gases nitrogen oxide and carbon monoxide.  Cigarette smokers have an increased risk of many serious medical problems and Smoking causes approximately:  90% of all lung cancer deaths in men.  80% of all lung cancer deaths in women.  90% of deaths from chronic obstructive lung disease. Compared with nonsmokers, smoking increases the risk of:  Coronary heart disease by 2 to 4 times.  Stroke by 2 to 4 times.  Men developing lung cancer by 23 times.  Women developing lung cancer by 13 times.  Dying from chronic obstructive lung diseases by 12 times.  . Smoking is the most preventable cause of death and disease in our society.  WHY IS SMOKING ADDICTIVE?  Nicotine is the chemical  agent in tobacco that is capable of causing addiction or dependence.  When you smoke and inhale, nicotine is absorbed rapidly into the bloodstream through your lungs. Nicotine absorbed through the lungs is capable of creating a powerful addiction. Both inhaled and non-inhaled nicotine may be addictive.  Addiction studies of cigarettes and spit tobacco show that addiction to nicotine occurs mainly during the teen years, when young people begin using tobacco products. WHAT ARE THE BENEFITS OF QUITTING?  There are many health benefits to quitting smoking.   Likelihood of developing cancer and heart disease decreases. Health improvements are seen almost immediately.  Blood pressure, pulse rate, and breathing patterns start returning to normal soon after quitting. QUITTING SMOKING   American Lung Association - 1-800-LUNGUSA  American Cancer Society - 1-800-ACS-2345 Document Released: 01/28/2004 Document Revised: 03/14/2011 Document Reviewed: 10/01/2008 ExitCare Patient Information 2013 Exit[employer]   LL[employer].   Stress Management Stress is a state of physical or mental tension that often results from changes in your life or normal routine. Some common causes of stress are:  Death of a loved one.  Injuries or severe illnesses.  Getting fired or changing jobs.  Moving into a new home. Other causes may be:  Sexual problems.  Business or financial losses.  Taking on a large debt.  Regular conflict with someone at home or at work.  Constant tiredness from lack of sleep. It is not just bad things that are stressful. It may be stressful to:  Win the lottery.  Get married.  Buy a new car. The amount of stress that can be easily tolerated varies from person to person. Changes generally cause stress, regardless of the types of change. Too much stress can affect your health. It may lead to physical or emotional problems. Too little stress (boredom) may also become stressful. SUGGESTIONS TO  REDUCE STRESS:  Talk things over with your family and friends. It often is helpful to share your concerns and worries. If you feel your problem is serious, you may want to get help from a professional counselor.  Consider your problems one at a time instead of lumping them all together. Trying to take care of everything at once may seem impossible. List all the things you need to do and then start with the most important one. Set a goal to accomplish 2 or 3 things each day. If you expect to do too many in a single day you will naturally fail, causing you to feel even more stressed.  Do not use alcohol or drugs to relieve stress. Although you may feel better for a short time, they do not remove the problems that caused the stress. They can also be habit forming.  Exercise regularly - at least 3 times per week. Physical exercise can help to relieve that "uptight" feeling and will relax you.  The shortest distance between despair and hope is often a good night's sleep.  Go to bed and get up on time allowing yourself time for appointments without being rushed.  Take a short "time-out" period from any stressful situation that occurs during the day. Close your eyes and take some deep breaths. Starting with the muscles in your face, tense them, hold it for a few seconds, then relax. Repeat this with the muscles in your neck, shoulders, hand, stomach, back and legs.  Take good care of yourself. Eat a balanced diet and get plenty of rest.  Schedule time for having fun. Take a break from your daily routine to relax. HOME CARE INSTRUCTIONS   Call if you feel overwhelmed by your problems and feel you can no longer manage them on your own.  Return immediately if you feel like hurting yourself or someone else. Document Released: 06/15/2000 Document Revised: 03/14/2011 Document Reviewed: 02/05/2007 ExitCare Patient Information 2013 [employer].   

## 2015-11-18 ENCOUNTER — Other Ambulatory Visit: Payer: Self-pay | Admitting: Certified Nurse Midwife

## 2015-11-18 DIAGNOSIS — B9689 Other specified bacterial agents as the cause of diseases classified elsewhere: Secondary | ICD-10-CM

## 2015-11-18 DIAGNOSIS — N760 Acute vaginitis: Principal | ICD-10-CM

## 2015-11-18 LAB — WET PREP BY MOLECULAR PROBE
CANDIDA SPECIES: NEGATIVE
Gardnerella vaginalis: POSITIVE — AB
Trichomonas vaginosis: NEGATIVE

## 2015-11-18 LAB — RPR

## 2015-11-18 LAB — GC/CHLAMYDIA PROBE AMP
CT Probe RNA: NOT DETECTED
GC Probe RNA: NOT DETECTED

## 2015-11-18 MED ORDER — METRONIDAZOLE 500 MG PO TABS: 500.0000 mg | ORAL_TABLET | Freq: Two times a day (BID) | ORAL | 0 refills | Status: DC

## 2015-11-24 NOTE — Progress Notes (Signed)
Encounter reviewed Jill Jertson, MD   

## 2016-09-27 ENCOUNTER — Encounter (HOSPITAL_BASED_OUTPATIENT_CLINIC_OR_DEPARTMENT_OTHER): Payer: Self-pay | Admitting: *Deleted

## 2016-09-27 ENCOUNTER — Emergency Department (HOSPITAL_BASED_OUTPATIENT_CLINIC_OR_DEPARTMENT_OTHER)
Admission: EM | Admit: 2016-09-27 | Discharge: 2016-09-27 | Disposition: A | Discharge status: Home / Self Care | Payer: PRIVATE HEALTH INSURANCE | Attending: Emergency Medicine | Admitting: Emergency Medicine

## 2016-09-27 DIAGNOSIS — H9202 Otalgia, left ear: Secondary | ICD-10-CM | POA: Diagnosis present

## 2016-09-27 DIAGNOSIS — H66005 Acute suppurative otitis media without spontaneous rupture of ear drum, recurrent, left ear: Secondary | ICD-10-CM | POA: Diagnosis not present

## 2016-09-27 DIAGNOSIS — J45909 Unspecified asthma, uncomplicated: Secondary | ICD-10-CM | POA: Diagnosis not present

## 2016-09-27 DIAGNOSIS — Z79899 Other long term (current) drug therapy: Secondary | ICD-10-CM | POA: Insufficient documentation

## 2016-09-27 MED ORDER — IBUPROFEN 600 MG PO TABS: 600.0000 mg | ORAL_TABLET | Freq: Four times a day (QID) | ORAL | 0 refills | Status: DC | PRN

## 2016-09-27 MED ORDER — AMOXICILLIN 500 MG PO CAPS
500.0000 mg | ORAL_CAPSULE | Freq: Once | ORAL | Status: AC
  Administered 2016-09-27: 500 mg via ORAL
  Filled 2016-09-27: qty 1

## 2016-09-27 MED ORDER — IBUPROFEN 800 MG PO TABS
800.0000 mg | ORAL_TABLET | Freq: Once | ORAL | Status: AC
  Administered 2016-09-27: 800 mg via ORAL
  Filled 2016-09-27: qty 1

## 2016-09-27 MED ORDER — AMOXICILLIN 500 MG PO CAPS: 500.0000 mg | ORAL_CAPSULE | Freq: Three times a day (TID) | ORAL | 0 refills | Status: DC

## 2016-09-27 NOTE — ED Triage Notes (Signed)
Left ear pain for a week

## 2016-09-27 NOTE — ED Provider Notes (Signed)
[facility]   CSN: [id number] Arrival date & time: 09/27/16  1751     History   Chief Complaint Chief Complaint  Patient presents with  . Otalgia    HPI Anita Patton is a 26 y.o. female.  Pt presents to the ED today with left ear pain.  The pt has a hx of ear trouble and Dr. Pollyann Kennedy (ENT) put a tube in her right ear a few years ago.  She has had sx for the past week.  Pt denies any n/v or f/c.       Past Medical History:  Diagnosis Date  . Asthma    as a child  . Papilledema    Bilateral  . STD (sexually transmitted disease)    + chlamydia 3/16    There are no active problems to display for this patient.   Past Surgical History:  Procedure Laterality Date  . TONSILLECTOMY  2004  . WISDOM TOOTH EXTRACTION      OB History    Gravida Para Term Preterm AB Living   0 0 0 0 0 0   SAB TAB Ectopic Multiple Live Births   0 0 0 0         Home Medications    Prior to Admission medications   Medication Sig Start Date End Date Taking? Authorizing Provider  amoxicillin (AMOXIL) 500 MG capsule Take 1 capsule (500 mg total) by mouth 3 (three) times daily. 09/27/16   Jacalyn Lefevre, MD  ibuprofen (ADVIL,MOTRIN) 600 MG tablet Take 1 tablet (600 mg total) by mouth every 6 (six) hours as needed. 09/27/16   Jacalyn Lefevre, MD  loratadine (CLARITIN) 10 MG tablet Take 10 mg by mouth daily.    [provider]  metroNIDAZOLE (FLAGYL) 500 MG tablet Take 1 tablet (500 mg total) by mouth 2 (two) times daily. 11/18/15   Verner Chol, CNM  ofloxacin (FLOXIN) 0.3 % otic solution Place 3 drops into the right ear 3 (three) times daily.    [provider]    Family History Family History  Problem Relation Age of Onset  . Hypertension Father   . Lung cancer Maternal Grandmother   . Lung cancer Paternal Grandmother   . Alzheimer's disease Paternal Grandfather     Social History Social History  Substance Use Topics  .  Smoking status: Never Smoker  . Smokeless tobacco: Never Used  . Alcohol use 0.6 oz/week    1 Standard drinks or equivalent per week     Comment: 2-3/month     Allergies   Patient has no known allergies.   Review of Systems Review of Systems  HENT: Positive for ear pain.   All other systems reviewed and are negative.    Physical Exam Updated Vital Signs BP 135/83   Pulse 71   Temp 98.2 F (36.8 C) (Oral)   Resp 18   Ht  (1.575 m)   Wt 117.9 kg (260 lb)   LMP 09/20/2016   SpO2 99%   BMI 47.55 kg/m   Physical Exam  Constitutional: She appears well-developed and well-nourished.  HENT:  Head: Normocephalic and atraumatic.  Right Ear: External ear normal.  Left Ear: External ear normal. Tympanic membrane is erythematous and bulging.  Nose: Nose normal.  Mouth/Throat: Oropharynx is clear and moist.  Blue tm tube in right ear  Eyes: Pupils are equal, round, and reactive to light.  Neck: Normal range of motion.  Cardiovascular: Normal rate, regular  rhythm, normal heart sounds and intact distal pulses.   Pulmonary/Chest: Effort normal and breath sounds normal.  Nursing note and vitals reviewed.    ED Treatments / Results  Labs (all labs ordered are listed, but only abnormal results are displayed) Labs Reviewed - No data to display  EKG  EKG Interpretation None       Radiology No results found.  Procedures Procedures (including critical care time)  Medications Ordered in ED Medications  amoxicillin (AMOXIL) capsule 500 mg (not administered)  ibuprofen (ADVIL,MOTRIN) tablet 800 mg (not administered)     Initial Impression / Assessment and Plan / ED Course  I have reviewed the triage vital signs and the nursing notes.  Pertinent labs & imaging results that were available during my care of the patient were reviewed by me and considered in my medical decision making (see chart for details).    Pt given first dose of amox here.  She is instructed  to f/u with ENT.  Return if worse.  Final Clinical Impressions(s) / ED Diagnoses   Final diagnoses:  Recurrent acute suppurative otitis media without spontaneous rupture of left tympanic membrane    New Prescriptions New Prescriptions   AMOXICILLIN (AMOXIL) 500 MG CAPSULE    Take 1 capsule (500 mg total) by mouth 3 (three) times daily.   IBUPROFEN (ADVIL,MOTRIN) 600 MG TABLET    Take 1 tablet (600 mg total) by mouth every 6 (six) hours as needed.     Jacalyn Lefevre, MD 09/27/16 [facility]

## 2016-09-29 DIAGNOSIS — H6502 Acute serous otitis media, left ear: Secondary | ICD-10-CM | POA: Insufficient documentation

## 2016-11-03 HISTORY — PX: INTRAUTERINE DEVICE (IUD) INSERTION: SHX5877

## 2016-11-17 ENCOUNTER — Other Ambulatory Visit: Payer: Self-pay

## 2016-11-17 ENCOUNTER — Ambulatory Visit (INDEPENDENT_AMBULATORY_CARE_PROVIDER_SITE_OTHER): Payer: PRIVATE HEALTH INSURANCE | Admitting: Certified Nurse Midwife

## 2016-11-17 ENCOUNTER — Other Ambulatory Visit (HOSPITAL_COMMUNITY)
Admission: RE | Admit: 2016-11-17 | Discharge: 2016-11-17 | Disposition: A | Discharge status: Home / Self Care | Payer: PRIVATE HEALTH INSURANCE | Source: Ambulatory Visit | Attending: Obstetrics & Gynecology | Admitting: Obstetrics & Gynecology

## 2016-11-17 ENCOUNTER — Encounter: Payer: Self-pay | Admitting: Certified Nurse Midwife

## 2016-11-17 VITALS — BP 116/70 | HR 64 | Resp 16 | Ht 62.75 in | Wt 266.0 lb

## 2016-11-17 DIAGNOSIS — Z124 Encounter for screening for malignant neoplasm of cervix: Secondary | ICD-10-CM | POA: Insufficient documentation

## 2016-11-17 DIAGNOSIS — Z113 Encounter for screening for infections with a predominantly sexual mode of transmission: Secondary | ICD-10-CM | POA: Diagnosis not present

## 2016-11-17 DIAGNOSIS — N912 Amenorrhea, unspecified: Secondary | ICD-10-CM | POA: Diagnosis not present

## 2016-11-17 DIAGNOSIS — Z01419 Encounter for gynecological examination (general) (routine) without abnormal findings: Secondary | ICD-10-CM | POA: Diagnosis not present

## 2016-11-17 LAB — POCT URINE PREGNANCY: PREG TEST UR: NEGATIVE

## 2016-11-17 NOTE — Progress Notes (Signed)
26 y.o. G0P0000 Single  African American Fe here for annual exam. Periods normal, no changes. Sexually active,no partner change. No STD screening needed. Interested in [place] and Paragard IUD. Using condoms consistently. Sees PCP prn. No health issues today.  Patient's last menstrual period was 10/17/2016 (exact date).          Sexually active: Yes.    The current method of family planning is none.    Exercising: Yes.    walking Smoker:  no  Health Maintenance: Pap:  11-07-13 neg History of Abnormal Pap: no MMG:  none Self Breast exams: yes Colonoscopy:  none BMD:   none TDaP:  2009 Shingles: no Pneumonia: no Hep C and HIV: HIV neg 2017 Labs: poct upt-neg   reports that  has never smoked. she has never used smokeless tobacco. She reports that she drinks alcohol. She reports that she does not use drugs.  Past Medical History:  Diagnosis Date  . Asthma    as a child  . Papilledema    Bilateral  . STD (sexually transmitted disease)    + chlamydia 3/16    Past Surgical History:  Procedure Laterality Date  . TONSILLECTOMY  2004  . WISDOM TOOTH EXTRACTION      Current Outpatient Medications  Medication Sig Dispense Refill  . ibuprofen (ADVIL,MOTRIN) 600 MG tablet Take 1 tablet (600 mg total) by mouth every 6 (six) hours as needed. 30 tablet 0  . loratadine (CLARITIN) 10 MG tablet Take 10 mg by mouth daily.    Marland Kitchen. ofloxacin (FLOXIN) 0.3 % otic solution Place 3 drops into the right ear 3 (three) times daily.     No current facility-administered medications for this visit.     Family History  Problem Relation Age of Onset  . Hypertension Father   . Lung cancer Maternal Grandmother   . Lung cancer Paternal Grandmother   . Alzheimer's disease Paternal Grandfather     ROS:  Pertinent items are noted in HPI.  Otherwise, a comprehensive ROS was negative.  Exam:   BP 116/70   Pulse 64   Resp 16   Ht 5' 2.75" (1.594 m)   Wt 266 lb (120.7 kg)   LMP 10/17/2016 (Exact Date)    BMI 47.50 kg/m  Height: 5' 2.75" (159.4 cm) Ht Readings from Last 3 Encounters:  11/17/16 5' 2.75" (1.594 m)  09/27/16 5\' 2"  (1.575 m)  11/17/15 5\' 3"  (1.6 m)    General appearance: alert, cooperative and appears stated age Head: Normocephalic, without obvious abnormality, atraumatic Neck: no adenopathy, supple, symmetrical, trachea midline and thyroid normal to inspection and palpation Lungs: clear to auscultation bilaterally Breasts: normal appearance, no masses or tenderness, No nipple retraction or dimpling, No nipple discharge or bleeding, No axillary or supraclavicular adenopathy Heart: regular rate and rhythm Abdomen: soft, non-tender; no masses,  no organomegaly Extremities: extremities normal, atraumatic, no cyanosis or edema Skin: Skin color, texture, turgor normal. No rashes or lesions Lymph nodes: Cervical, supraclavicular, and axillary nodes normal. No abnormal inguinal nodes palpated Neurologic: Grossly normal   Pelvic: External genitalia:  no lesions              Urethra:  normal appearing urethra with no masses, tenderness or lesions              Bartholin's and Skene's: normal                 Vagina: normal appearing vagina with normal color and discharge, no lesions  Cervix: no cervical motion tenderness, no lesions and nulliparous appearance              Pap taken: Yes.   Bimanual Exam:  Uterus:  normal size, contour, position, consistency, mobility, non-tender              Adnexa: normal adnexa and no mass, fullness, tenderness               Rectovaginal: Confirms               Anus:  normal appearance  Chaperone present: yes  A:  Well Woman with normal exam  Contraception  Condoms consistent  Obese no weight loss  Interested in IUD for contraception  STD screening  GC/Chlamydia only  P:   Reviewed health and wellness pertinent to exam  Encouraged to continue to work on weight for better health  Risks/benefits/insertion/removal and  bleeding expectations given. Questions addressed. Will need to call o period day 1-5 for insertion. Given pamphlet information and insurance sheet. Patient will need to call if she desires insertion of Kyleena or Paragard.   Lab:GC/Chlamydia  Pap smear: yes   counseled on breast self exam, STD prevention, HIV risk factors and prevention, adequate intake of calcium and vitamin D, diet and exercise  return annually or prn  An After Visit Summary was printed and given to the patient.

## 2016-11-18 ENCOUNTER — Telehealth: Payer: Self-pay | Admitting: Certified Nurse Midwife

## 2016-11-18 DIAGNOSIS — Z30430 Encounter for insertion of intrauterine contraceptive device: Secondary | ICD-10-CM

## 2016-11-18 LAB — GC/CHLAMYDIA PROBE AMP
CHLAMYDIA, DNA PROBE: NEGATIVE
NEISSERIA GONORRHOEAE BY PCR: NEGATIVE

## 2016-11-18 LAB — CYTOLOGY - PAP: Diagnosis: NEGATIVE

## 2016-11-18 NOTE — Telephone Encounter (Signed)
Patient would like to have IUD insertion.  States she is on her cycle now.

## 2016-11-18 NOTE — Telephone Encounter (Signed)
Spoke with patient. Patient started her menses today 11/18/2016 and would like to schedule an appointment to have a Kyleena placed. Appointment scheduled for 11/21/2016 at 2:30 pm with Dr.Jertson. Patient is agreeable to date and time. Pre procedure instructions given.  Motrin instructions given. Motrin=Advil=Ibuprofen, 800 mg one hour before appointment. Eat a meal and hydrate well before appointment. Order placed.  Routing to provider for final review. Patient agreeable to disposition. Will close encounter.

## 2016-11-21 ENCOUNTER — Ambulatory Visit (INDEPENDENT_AMBULATORY_CARE_PROVIDER_SITE_OTHER): Payer: PRIVATE HEALTH INSURANCE | Admitting: Obstetrics and Gynecology

## 2016-11-21 ENCOUNTER — Encounter: Payer: Self-pay | Admitting: Obstetrics and Gynecology

## 2016-11-21 ENCOUNTER — Other Ambulatory Visit: Payer: Self-pay

## 2016-11-21 VITALS — BP 130/78 | HR 68 | Resp 18 | Wt 267.0 lb

## 2016-11-21 DIAGNOSIS — Z30430 Encounter for insertion of intrauterine contraceptive device: Secondary | ICD-10-CM | POA: Diagnosis not present

## 2016-11-21 DIAGNOSIS — Z3009 Encounter for other general counseling and advice on contraception: Secondary | ICD-10-CM | POA: Diagnosis not present

## 2016-11-21 DIAGNOSIS — Z01812 Encounter for preprocedural laboratory examination: Secondary | ICD-10-CM | POA: Diagnosis not present

## 2016-11-21 LAB — POCT URINE PREGNANCY: Preg Test, Ur: NEGATIVE

## 2016-11-21 NOTE — Patient Instructions (Signed)

## 2016-11-21 NOTE — Progress Notes (Signed)
GYNECOLOGY  VISIT   HPI: 26 y.o.   Single  African American  female   G0P0000 with Patient's last menstrual period was 11/18/2016.   [facility]. She has been with the same partner x 2 year. Not always using condoms.  Negative STD testing last week.      GYNECOLOGIC HISTORY: Patient's last menstrual period was 11/18/2016. Contraception:none Menopausal hormone therapy: none        OB History    Gravida Para Term Preterm AB Living   0 0 0 0 0 0   SAB TAB Ectopic Multiple Live Births   0 0 0 0           Patient Active Problem List   Diagnosis Date Noted  . Acute serous otitis media of left ear 09/29/2016    Past Medical History:  Diagnosis Date  . Asthma    as a child  . Papilledema    Bilateral  . STD (sexually transmitted disease)    + chlamydia 3/16    Past Surgical History:  Procedure Laterality Date  . TONSILLECTOMY  2004  . WISDOM TOOTH EXTRACTION      Current Outpatient Medications  Medication Sig Dispense Refill  . ibuprofen (ADVIL,MOTRIN) 600 MG tablet Take 1 tablet (600 mg total) by mouth every 6 (six) hours as needed. 30 tablet 0  . loratadine (CLARITIN) 10 MG tablet Take 10 mg by mouth daily.    Marland Kitchen. ofloxacin (FLOXIN) 0.3 % otic solution Place 3 drops into the right ear 3 (three) times daily.     No current facility-administered medications for this visit.      ALLERGIES: Patient has no known allergies.  Family History  Problem Relation Age of Onset  . Hypertension Father   . Lung cancer Maternal Grandmother   . Lung cancer Paternal Grandmother   . Alzheimer's disease Paternal Grandfather     Social History   Socioeconomic History  . Marital status: Single    Spouse name: Not on file  . Number of children: 0  . Years of education: 3716  . Highest education level: Not on file  Social Needs  . Financial resource strain: Not on file  . Food insecurity - worry: Not on file  . Food insecurity - inability: Not on file  .  Transportation needs - medical: Not on file  . Transportation needs - non-medical: Not on file  Occupational History    Comment: [employer]  Tobacco Use  . Smoking status: Never Smoker  . Smokeless tobacco: Never Used  Substance and Sexual Activity  . Alcohol use: Yes    Comment: 0-2 a month  . Drug use: No  . Sexual activity: Yes    Partners: Male    Birth control/protection: None  Other Topics Concern  . Not on file  Social History Narrative   Lives with roommate   Caffeine- 2 per week    Review of Systems  Constitutional: Negative.   HENT: Negative.   Eyes: Negative.   Respiratory: Negative.   Cardiovascular: Negative.   Gastrointestinal: Negative.   Genitourinary: Negative.   Musculoskeletal: Negative.   Skin: Negative.   Neurological: Negative.   Endo/Heme/Allergies: Negative.   Psychiatric/Behavioral: Negative.     PHYSICAL EXAMINATION:    BP 130/78 (BP Location: Right Arm, Patient Position: Sitting, Cuff Size: Normal)   Pulse 68   Resp 18   Wt 267 lb (121.1 kg)   LMP 11/18/2016   BMI 47.67 kg/m  General appearance: alert, cooperative and appears stated age  Pelvic: External genitalia:  no lesions              Urethra:  normal appearing urethra with no masses, tenderness or lesions              Bartholins and Skenes: normal                 Vagina: normal appearing vagina with normal color and discharge, no lesions              Cervix: no lesions              Bimanual Exam:  Uterus:  normal size, contour, position, consistency, mobility, non-tender and retroverted              Adnexa: no mass, fullness, tenderness               The risks of the [facility] were reviewed with the patient, including infection, abnormal bleeding and uterine perfortion. Consent was signed.  A speculum was placed in the vagina, the cervix was cleansed with betadine. A tenaculum was placed on the cervix, the uterus sounded to 9 cm. A #4 hagar dilator was easily passed  into the uterine cavity.  [facility] was inserted without difficulty. The string were cut to 3-4 cm. The tenaculum was removed. Slight oozing from the tenaculum site was stopped with pressure.   The patient tolerated the procedure well.    Chaperone was present for exam.  ASSESSMENT IUD insertion. Reviewed different options for IUD's, reviewed risks and side effects.     PLAN Kyleena IUD placed F/U in 1 month Condoms recommended for STD protection   An After Visit Summary was printed and given to the patient.  Over 10 minutes face to face time of which over 50% was spent in counseling.   CC: Sara Chuebbie Leonard, CNM

## 2016-12-21 ENCOUNTER — Ambulatory Visit (INDEPENDENT_AMBULATORY_CARE_PROVIDER_SITE_OTHER): Payer: PRIVATE HEALTH INSURANCE | Admitting: Obstetrics and Gynecology

## 2016-12-21 ENCOUNTER — Other Ambulatory Visit: Payer: Self-pay

## 2016-12-21 ENCOUNTER — Encounter: Payer: Self-pay | Admitting: Obstetrics and Gynecology

## 2016-12-21 VITALS — BP 132/78 | HR 60 | Resp 16 | Wt 272.0 lb

## 2016-12-21 DIAGNOSIS — Z30431 Encounter for routine checking of intrauterine contraceptive device: Secondary | ICD-10-CM

## 2016-12-21 NOTE — Progress Notes (Signed)
GYNECOLOGY  VISIT   HPI: 26 y.o.   Single  African American  female   G0P0000 with Patient's last menstrual period was 11/21/2016.   here for IUD check. She had kyleena inserted last month. No real cycle, just consistent spotting. No pain, no dyspareunia.     GYNECOLOGIC HISTORY: Patient's last menstrual period was 11/21/2016. Contraception:IUD (Kyleena) Menopausal hormone therapy: none         OB History    Gravida Para Term Preterm AB Living   0 0 0 0 0 0   SAB TAB Ectopic Multiple Live Births   0 0 0 0           Patient Active Problem List   Diagnosis Date Noted  . Acute serous otitis media of left ear 09/29/2016    Past Medical History:  Diagnosis Date  . Asthma    as a child  . Papilledema    Bilateral  . STD (sexually transmitted disease)    + chlamydia 3/16    Past Surgical History:  Procedure Laterality Date  . INTRAUTERINE DEVICE (IUD) INSERTION  11/2016   Kyleena   . TONSILLECTOMY  2004  . WISDOM TOOTH EXTRACTION      Current Outpatient Medications  Medication Sig Dispense Refill  . ibuprofen (ADVIL,MOTRIN) 600 MG tablet Take 1 tablet (600 mg total) by mouth every 6 (six) hours as needed. 30 tablet 0  . Levonorgestrel (KYLEENA) 19.5 MG IUD by Intrauterine route.    . loratadine (CLARITIN) 10 MG tablet Take 10 mg by mouth daily.    Marland Kitchen. ofloxacin (FLOXIN) 0.3 % otic solution Place 3 drops into the right ear 3 (three) times daily.     No current facility-administered medications for this visit.      ALLERGIES: Patient has no known allergies.  Family History  Problem Relation Age of Onset  . Hypertension Father   . Lung cancer Maternal Grandmother   . Lung cancer Paternal Grandmother   . Alzheimer's disease Paternal Grandfather     Social History   Socioeconomic History  . Marital status: Single    Spouse name: Not on file  . Number of children: 0  . Years of education: 6016  . Highest education level: Not on file  Social Needs  . Financial  resource strain: Not on file  . Food insecurity - worry: Not on file  . Food insecurity - inability: Not on file  . Transportation needs - medical: Not on file  . Transportation needs - non-medical: Not on file  Occupational History    Comment: [employer]  Tobacco Use  . Smoking status: Never Smoker  . Smokeless tobacco: Never Used  Substance and Sexual Activity  . Alcohol use: Yes    Comment: 0-2 a month  . Drug use: No  . Sexual activity: Yes    Partners: Male    Birth control/protection: IUD    Comment: Kyleena placed 11/2016   Other Topics Concern  . Not on file  Social History Narrative   Lives with roommate   Caffeine- 2 per week    Review of Systems  Constitutional: Negative.   HENT: Negative.   Eyes: Negative.   Respiratory: Negative.   Cardiovascular: Negative.   Gastrointestinal: Negative.   Genitourinary: Negative.   Musculoskeletal: Negative.   Skin: Negative.   Neurological: Negative.   Endo/Heme/Allergies: Negative.   Psychiatric/Behavioral: Negative.     PHYSICAL EXAMINATION:    BP 132/78 (BP Location: Right Arm, Patient Position:  Sitting, Cuff Size: Large)   Pulse 60   Resp 16   Wt 272 lb (123.4 kg)   LMP 11/21/2016   BMI 48.57 kg/m     General appearance: alert, cooperative and appears stated age  Pelvic: External genitalia:  no lesions              Urethra:  normal appearing urethra with no masses, tenderness or lesions              Bartholins and Skenes: normal                 Vagina: normal appearing vagina with normal color and discharge, no lesions              Cervix: no lesions and IUD string 4 cm              Bimanual Exam:  Uterus:  normal size, contour, position, consistency, mobility, non-tender              Adnexa: no mass, fullness, tenderness               Chaperone was present for exam.  ASSESSMENT IUD check, doing well    PLAN Routine f/u with Ms Darcel BayleyLeonard   An After Visit Summary was printed and given to the  patient.  CC: Sara Chuebbie Leonard, CNM

## 2017-01-06 DIAGNOSIS — J069 Acute upper respiratory infection, unspecified: Secondary | ICD-10-CM | POA: Diagnosis not present

## 2017-05-06 DIAGNOSIS — H65191 Other acute nonsuppurative otitis media, right ear: Secondary | ICD-10-CM | POA: Diagnosis not present

## 2017-11-23 ENCOUNTER — Encounter: Payer: Self-pay | Admitting: Certified Nurse Midwife

## 2017-11-23 ENCOUNTER — Other Ambulatory Visit: Payer: Self-pay

## 2017-11-23 ENCOUNTER — Ambulatory Visit (INDEPENDENT_AMBULATORY_CARE_PROVIDER_SITE_OTHER): Payer: BLUE CROSS/BLUE SHIELD | Admitting: Certified Nurse Midwife

## 2017-11-23 VITALS — BP 118/78 | HR 68 | Resp 16 | Ht 63.25 in | Wt 279.0 lb

## 2017-11-23 DIAGNOSIS — Z23 Encounter for immunization: Secondary | ICD-10-CM

## 2017-11-23 DIAGNOSIS — Z30431 Encounter for routine checking of intrauterine contraceptive device: Secondary | ICD-10-CM

## 2017-11-23 DIAGNOSIS — R635 Abnormal weight gain: Secondary | ICD-10-CM | POA: Diagnosis not present

## 2017-11-23 DIAGNOSIS — Z01419 Encounter for gynecological examination (general) (routine) without abnormal findings: Secondary | ICD-10-CM

## 2017-11-23 NOTE — Patient Instructions (Signed)
General topics  Next pap or exam is  due in 1 year Take a Women's multivitamin Take 1200 mg. of calcium daily - prefer dietary If any concerns in interim to call back  Breast Self-Awareness Practicing breast self-awareness may pick up problems early, prevent significant medical complications, and possibly save your life. By practicing breast self-awareness, you can become familiar with how your breasts look and feel and if your breasts are changing. This allows you to notice changes early. It can also offer you some reassurance that your breast health is good. One way to learn what is normal for your breasts and whether your breasts are changing is to do a breast self-exam. If you find a lump or something that was not present in the past, it is best to contact your caregiver right away. Other findings that should be evaluated by your caregiver include nipple discharge, especially if it is bloody; skin changes or reddening; areas where the skin seems to be pulled in (retracted); or new lumps and bumps. Breast pain is seldom associated with cancer (malignancy), but should also be evaluated by a caregiver. BREAST SELF-EXAM The best time to examine your breasts is 5 7 days after your menstrual period is over.  ExitCare Patient Information 2013 [employer].   Exercise to Stay Healthy Exercise helps you become and stay healthy. EXERCISE IDEAS AND TIPS Choose exercises that:  You enjoy.  Fit into your day. You do not need to exercise really hard to be healthy. You can do exercises at a slow or medium level and stay healthy. You can:  Stretch before and after working out.  Try yoga, Pilates, or tai chi.  Lift weights.  Walk fast, swim, jog, run, climb stairs, bicycle, dance, or rollerskate.  Take aerobic classes. Exercises that burn about 150 calories:  Running 1  miles in 15 minutes.  Playing volleyball for 45 to 60 minutes.  Washing and waxing a car for 45 to 60  minutes.  Playing touch football for 45 minutes.  Walking 1  miles in 35 minutes.  Pushing a stroller 1  miles in 30 minutes.  Playing basketball for 30 minutes.  Raking leaves for 30 minutes.  Bicycling 5 miles in 30 minutes.  Walking 2 miles in 30 minutes.  Dancing for 30 minutes.  Shoveling snow for 15 minutes.  Swimming laps for 20 minutes.  Walking up stairs for 15 minutes.  Bicycling 4 miles in 15 minutes.  Gardening for 30 to 45 minutes.  Jumping rope for 15 minutes.  Washing windows or floors for 45 to 60 minutes. Document Released: 01/22/2010 Document Revised: 03/14/2011 Document Reviewed: 01/22/2010 ExitCare Patient Information 2013 [employer].   Other topics ( that may be useful information):    Sexually Transmitted Disease Sexually transmitted disease (STD) refers to any infection that is passed from person to person during sexual activity. This may happen by way of saliva, semen, blood, vaginal mucus, or urine. Common STDs include:  Gonorrhea.  Chlamydia.  Syphilis.  HIV/AIDS.  Genital herpes.  Hepatitis B and C.  Trichomonas.  Human papillomavirus (HPV).  Pubic lice. CAUSES  An STD may be spread by bacteria, virus, or parasite. A person can get an STD by:  Sexual intercourse with an infected person.  Sharing sex toys with an infected person.  Sharing needles with an infected person.  Having intimate contact with the genitals, mouth, or rectal areas of an infected person. SYMPTOMS  Some people may not have any symptoms, but   they can still pass the infection to others. Different STDs have different symptoms. Symptoms include:  Painful or bloody urination.  Pain in the pelvis, abdomen, vagina, anus, throat, or eyes.  Skin rash, itching, irritation, growths, or sores (lesions). These usually occur in the genital or anal area.  Abnormal vaginal discharge.  Penile discharge in men.  Soft, flesh-colored skin growths in the  genital or anal area.  Fever.  Pain or bleeding during sexual intercourse.  Swollen glands in the groin area.  Yellow skin and eyes (jaundice). This is seen with hepatitis. DIAGNOSIS  To make a diagnosis, your caregiver may:  Take a medical history.  Perform a physical exam.  Take a specimen (culture) to be examined.  Examine a sample of discharge under a microscope.  Perform blood test TREATMENT   Chlamydia, gonorrhea, trichomonas, and syphilis can be cured with antibiotic medicine.  Genital herpes, hepatitis, and HIV can be treated, but not cured, with prescribed medicines. The medicines will lessen the symptoms.  Genital warts from HPV can be treated with medicine or by freezing, burning (electrocautery), or surgery. Warts may come back.  HPV is a virus and cannot be cured with medicine or surgery.However, abnormal areas may be followed very closely by your caregiver and may be removed from the cervix, vagina, or vulva through office procedures or surgery. If your diagnosis is confirmed, your recent sexual partners need treatment. This is true even if they are symptom-free or have a negative culture or evaluation. They should not have sex until their caregiver says it is okay. HOME CARE INSTRUCTIONS  All sexual partners should be informed, tested, and treated for all STDs.  Take your antibiotics as directed. Finish them even if you start to feel better.  Only take over-the-counter or prescription medicines for pain, discomfort, or fever as directed by your caregiver.  Rest.  Eat a balanced diet and drink enough fluids to keep your urine clear or pale yellow.  Do not have sex until treatment is completed and you have followed up with your caregiver. STDs should be checked after treatment.  Keep all follow-up appointments, Pap tests, and blood tests as directed by your caregiver.  Only use latex condoms and water-soluble lubricants during sexual activity. Do not use  petroleum jelly or oils.  Avoid alcohol and illegal drugs.  Get vaccinated for HPV and hepatitis. If you have not received these vaccines in the past, talk to your caregiver about whether one or both might be right for you.  Avoid risky sex practices that can break the skin. The only way to avoid getting an STD is to avoid all sexual activity.Latex condoms and dental dams (for oral sex) will help lessen the risk of getting an STD, but will not completely eliminate the risk. SEEK MEDICAL CARE IF:   You have a fever.  You have any new or worsening symptoms. Document Released: 03/12/2002 Document Revised: 03/14/2011 Document Reviewed: 03/19/2010 [facility] Patient Information 2013 [employer].    Domestic Abuse You are being battered or abused if someone close to you hits, pushes, or physically hurts you in any way. You also are being abused if you are forced into activities. You are being sexually abused if you are forced to have sexual contact of any kind. You are being emotionally abused if you are made to feel worthless or if you are constantly threatened. It is important to remember that help is available. No one has the right to abuse you. PREVENTION OF FURTHER  ABUSE  Learn the warning signs of danger. This varies with situations but may include: the use of alcohol, threats, isolation from friends and family, or forced sexual contact. Leave if you feel that violence is going to occur.  If you are attacked or beaten, report it to the police so the abuse is documented. You do not have to press charges. The police can protect you while you or the attackers are leaving. Get the officer's name and badge number and a copy of the report.  Find someone you can trust and tell them what is happening to you: your caregiver, a nurse, clergy member, close friend or family member. Feeling ashamed is natural, but remember that you have done nothing wrong. No one deserves abuse. Document Released:  12/18/1999 Document Revised: 03/14/2011 Document Reviewed: 02/25/2010 ExitCare Patient Information 2013 [employer].    How Much is Too Much Alcohol? Drinking too much alcohol can cause injury, accidents, and health problems. These types of problems can include:   Car crashes.  Falls.  Family fighting (domestic violence).  Drowning.  Fights.  Injuries.  Burns.  Damage to certain organs.  Having a baby with birth defects. ONE DRINK CAN BE TOO MUCH WHEN YOU ARE:  Working.  Pregnant or breastfeeding.  Taking medicines. Ask your doctor.  Driving or planning to drive. If you or someone you know has a drinking problem, get help from a doctor.  Document Released: 10/16/2008 Document Revised: 03/14/2011 Document Reviewed: 10/16/2008 ExitCare Patient Information 2013 [employer].   Smoking Hazards Smoking cigarettes is extremely bad for your health. Tobacco smoke has over 200 known poisons in it. There are over 60 chemicals in tobacco smoke that cause cancer. Some of the chemicals found in cigarette smoke include:   Cyanide.  Benzene.  Formaldehyde.  Methanol (wood alcohol).  Acetylene (fuel used in welding torches).  Ammonia. Cigarette smoke also contains the poisonous gases nitrogen oxide and carbon monoxide.  Cigarette smokers have an increased risk of many serious medical problems and Smoking causes approximately:  90% of all lung cancer deaths in men.  80% of all lung cancer deaths in women.  90% of deaths from chronic obstructive lung disease. Compared with nonsmokers, smoking increases the risk of:  Coronary heart disease by 2 to 4 times.  Stroke by 2 to 4 times.  Men developing lung cancer by 23 times.  Women developing lung cancer by 13 times.  Dying from chronic obstructive lung diseases by 12 times.  . Smoking is the most preventable cause of death and disease in our society.  WHY IS SMOKING ADDICTIVE?  Nicotine is the chemical  agent in tobacco that is capable of causing addiction or dependence.  When you smoke and inhale, nicotine is absorbed rapidly into the bloodstream through your lungs. Nicotine absorbed through the lungs is capable of creating a powerful addiction. Both inhaled and non-inhaled nicotine may be addictive.  Addiction studies of cigarettes and spit tobacco show that addiction to nicotine occurs mainly during the teen years, when young people begin using tobacco products. WHAT ARE THE BENEFITS OF QUITTING?  There are many health benefits to quitting smoking.   Likelihood of developing cancer and heart disease decreases. Health improvements are seen almost immediately.  Blood pressure, pulse rate, and breathing patterns start returning to normal soon after quitting. QUITTING SMOKING   American Lung Association - 1-800-LUNGUSA  American Cancer Society - 1-800-ACS-2345 Document Released: 01/28/2004 Document Revised: 03/14/2011 Document Reviewed: 10/01/2008 ExitCare Patient Information 2013 Exit[employer]   LL[employer].   Stress Management Stress is a state of physical or mental tension that often results from changes in your life or normal routine. Some common causes of stress are:  Death of a loved one.  Injuries or severe illnesses.  Getting fired or changing jobs.  Moving into a new home. Other causes may be:  Sexual problems.  Business or financial losses.  Taking on a large debt.  Regular conflict with someone at home or at work.  Constant tiredness from lack of sleep. It is not just bad things that are stressful. It may be stressful to:  Win the lottery.  Get married.  Buy a new car. The amount of stress that can be easily tolerated varies from person to person. Changes generally cause stress, regardless of the types of change. Too much stress can affect your health. It may lead to physical or emotional problems. Too little stress (boredom) may also become stressful. SUGGESTIONS TO  REDUCE STRESS:  Talk things over with your family and friends. It often is helpful to share your concerns and worries. If you feel your problem is serious, you may want to get help from a professional counselor.  Consider your problems one at a time instead of lumping them all together. Trying to take care of everything at once may seem impossible. List all the things you need to do and then start with the most important one. Set a goal to accomplish 2 or 3 things each day. If you expect to do too many in a single day you will naturally fail, causing you to feel even more stressed.  Do not use alcohol or drugs to relieve stress. Although you may feel better for a short time, they do not remove the problems that caused the stress. They can also be habit forming.  Exercise regularly - at least 3 times per week. Physical exercise can help to relieve that "uptight" feeling and will relax you.  The shortest distance between despair and hope is often a good night's sleep.  Go to bed and get up on time allowing yourself time for appointments without being rushed.  Take a short "time-out" period from any stressful situation that occurs during the day. Close your eyes and take some deep breaths. Starting with the muscles in your face, tense them, hold it for a few seconds, then relax. Repeat this with the muscles in your neck, shoulders, hand, stomach, back and legs.  Take good care of yourself. Eat a balanced diet and get plenty of rest.  Schedule time for having fun. Take a break from your daily routine to relax. HOME CARE INSTRUCTIONS   Call if you feel overwhelmed by your problems and feel you can no longer manage them on your own.  Return immediately if you feel like hurting yourself or someone else. Document Released: 06/15/2000 Document Revised: 03/14/2011 Document Reviewed: 02/05/2007 ExitCare Patient Information 2013 [employer].   

## 2017-11-23 NOTE — Progress Notes (Signed)
27 y.o. G0P0000 Single  African American Fe here for annual exam.  Periods monthly with IUD during the first year and no period this month! No partner change, or STD testing today. Working on healthy weight with exercise and dietary changes. Sees PCP if needed. No health issues today.  No LMP recorded.          Sexually active: Yes.    The current method of family planning is IUD.    Exercising: Yes.    walking & eliptical Smoker:  no  Review of Systems  Constitutional: Negative.   HENT: Negative.   Eyes: Negative.   Respiratory: Negative.   Cardiovascular: Negative.   Gastrointestinal: Negative.   Genitourinary:       Irregular cycle with iud  Musculoskeletal: Negative.   Skin: Negative.   Neurological: Negative.   Endo/Heme/Allergies: Negative.   Psychiatric/Behavioral: Negative.     Health Maintenance: Pap:  11-17-16 neg History of Abnormal Pap: no MMG:  none Self Breast exams: yes Colonoscopy:  none BMD:   none TDaP:  2009 Shingles: no Pneumonia: no Hep C and HIV: HIV neg 2017, hep c neg 2016 Labs: if needed   reports that she has never smoked. She has never used smokeless tobacco. She reports that she drinks alcohol. She reports that she does not use drugs.  Past Medical History:  Diagnosis Date  . Asthma    as a child  . Papilledema    Bilateral  . STD (sexually transmitted disease)    + chlamydia 3/16    Past Surgical History:  Procedure Laterality Date  . INTRAUTERINE DEVICE (IUD) INSERTION  11/2016   Kyleena   . TONSILLECTOMY  2004  . WISDOM TOOTH EXTRACTION      Current Outpatient Medications  Medication Sig Dispense Refill  . ibuprofen (ADVIL,MOTRIN) 600 MG tablet Take 1 tablet (600 mg total) by mouth every 6 (six) hours as needed. 30 tablet 0  . Levonorgestrel (KYLEENA) 19.5 MG IUD by Intrauterine route.    . loratadine (CLARITIN) 10 MG tablet Take 10 mg by mouth daily.    Marland Kitchen. ofloxacin (FLOXIN) 0.3 % otic solution Place 3 drops into the right  ear 3 (three) times daily.     No current facility-administered medications for this visit.     Family History  Problem Relation Age of Onset  . Hypertension Father   . Lung cancer Maternal Grandmother   . Lung cancer Paternal Grandmother   . Alzheimer's disease Paternal Grandfather     ROS:  Pertinent items are noted in HPI.  Otherwise, a comprehensive ROS was negative.  Exam:   There were no vitals taken for this visit.   Ht Readings from Last 3 Encounters:  11/17/16 5' 2.75" (1.594 m)  09/27/16 5\' 2"  (1.575 m)  11/17/15 5\' 3"  (1.6 m)    General appearance: alert, cooperative and appears stated age Head: Normocephalic, without obvious abnormality, atraumatic Neck: no adenopathy, supple, symmetrical, trachea midline and thyroid normal to inspection and palpation Lungs: clear to auscultation bilaterally Breasts: normal appearance, no masses or tenderness, No nipple retraction or dimpling, No nipple discharge or bleeding, No axillary or supraclavicular adenopathy, Taught monthly breast self examination Heart: regular rate and rhythm Abdomen: soft, non-tender; no masses,  no organomegaly Extremities: extremities normal, atraumatic, no cyanosis or edema Skin: Skin color, texture, turgor normal. No rashes or lesions Lymph nodes: Cervical, supraclavicular, and axillary nodes normal. No abnormal inguinal nodes palpated Neurologic: Grossly normal   Pelvic: External genitalia:  no lesions              Urethra:  normal appearing urethra with no masses, tenderness or lesions              Bartholin's and Skene's: normal                 Vagina: normal appearing vagina with normal color and discharge, no lesions              Cervix: no cervical motion tenderness, no lesions, nulliparous appearance and IUD string noted in cervix              Pap taken: No. Bimanual Exam:  Uterus:  normal size, contour, position, consistency, mobility, non-tender              Adnexa: normal adnexa and no  mass, fullness, tenderness               Rectovaginal: Confirms               Anus:  normal sphincter tone, no lesions  Chaperone present: yes  A:  Well Woman with normal exam  Contraception Kyleena due for removal 11/22/2021  Immunization update  P:   Reviewed health and wellness pertinent to exam  Warning signs with IUD and bleeding expectations reviewed  Requests TDAP  Pap smear: no   Reviewed healthy diet and exercise importance, include calcium and Vitamin D daily, STD prevention and monthly SBE recommended.  return annually or prn  An After Visit Summary was printed and given to the patient.

## 2018-02-07 ENCOUNTER — Emergency Department (HOSPITAL_BASED_OUTPATIENT_CLINIC_OR_DEPARTMENT_OTHER)
Admission: EM | Admit: 2018-02-07 | Discharge: 2018-02-07 | Disposition: A | Discharge status: Home / Self Care | Payer: BLUE CROSS/BLUE SHIELD | Attending: Emergency Medicine | Admitting: Emergency Medicine

## 2018-02-07 ENCOUNTER — Other Ambulatory Visit: Payer: Self-pay

## 2018-02-07 ENCOUNTER — Encounter (HOSPITAL_BASED_OUTPATIENT_CLINIC_OR_DEPARTMENT_OTHER): Payer: Self-pay

## 2018-02-07 DIAGNOSIS — M7918 Myalgia, other site: Secondary | ICD-10-CM

## 2018-02-07 DIAGNOSIS — M25512 Pain in left shoulder: Secondary | ICD-10-CM | POA: Insufficient documentation

## 2018-02-07 DIAGNOSIS — M25511 Pain in right shoulder: Secondary | ICD-10-CM | POA: Diagnosis not present

## 2018-02-07 DIAGNOSIS — Z79899 Other long term (current) drug therapy: Secondary | ICD-10-CM | POA: Diagnosis not present

## 2018-02-07 DIAGNOSIS — J45909 Unspecified asthma, uncomplicated: Secondary | ICD-10-CM | POA: Diagnosis not present

## 2018-02-07 DIAGNOSIS — M546 Pain in thoracic spine: Secondary | ICD-10-CM | POA: Diagnosis not present

## 2018-02-07 DIAGNOSIS — M549 Dorsalgia, unspecified: Secondary | ICD-10-CM | POA: Diagnosis not present

## 2018-02-07 MED ORDER — MELOXICAM 7.5 MG PO TABS: 7.5000 mg | ORAL_TABLET | Freq: Every day | ORAL | 0 refills | Status: AC

## 2018-02-07 MED ORDER — IBUPROFEN 800 MG PO TABS
800.0000 mg | ORAL_TABLET | Freq: Once | ORAL | Status: AC
  Administered 2018-02-07: 800 mg via ORAL
  Filled 2018-02-07: qty 1

## 2018-02-07 MED ORDER — METHOCARBAMOL 500 MG PO TABS: 500.0000 mg | ORAL_TABLET | Freq: Two times a day (BID) | ORAL | 0 refills | Status: DC
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
## 2018-02-07 NOTE — Discharge Instructions (Addendum)
Take meloxicam and Robaxin as needed as prescribed for pain. Apply warm compresses to sore muscles 20 minutes at a time. Follow-up with your primary care provider.  Return to ER for new or worsening symptoms.

## 2018-02-07 NOTE — ED Notes (Signed)
PT states understanding of care given, follow up care, and medication prescribed. PT ambulated from ED to car with a steady gait. 
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
## 2018-12-06 ENCOUNTER — Ambulatory Visit: Payer: BLUE CROSS/BLUE SHIELD | Admitting: Certified Nurse Midwife

## 2018-12-06 ENCOUNTER — Other Ambulatory Visit: Payer: Self-pay

## 2018-12-07 NOTE — Progress Notes (Deleted)
28 y.o. G0P0000 Single  African American Fe here for annual exam.    No LMP recorded. (Menstrual status: IUD).          Sexually active: {yes no:314532}  The current method of family planning is {contraception:315051}.    Exercising: {yes no:314532}  {types:19826} Smoker:  {YES NO:22349}  ROS  Health Maintenance: Pap:  11-17-16 neg History of Abnormal Pap: no MMG:  none Self Breast exams: {YES NO:22349} Colonoscopy:  none BMD:   none TDaP:  2019 Shingles: no Pneumonia: no Hep C and HIV: HIV neg 2017, Hep c neg 2016 Labs: ***   reports that she has never smoked. She has never used smokeless tobacco. She reports current alcohol use. She reports that she does not use drugs.  Past Medical History:  Diagnosis Date  . Asthma    as a child  . Papilledema    Bilateral  . STD (sexually transmitted disease)    + chlamydia 3/16    Past Surgical History:  Procedure Laterality Date  . INTRAUTERINE DEVICE (IUD) INSERTION  11/2016   Kyleena   . TONSILLECTOMY  2004  . WISDOM TOOTH EXTRACTION      Current Outpatient Medications  Medication Sig Dispense Refill  . Levonorgestrel (KYLEENA) 19.5 MG IUD by Intrauterine route.    . methocarbamol (ROBAXIN) 500 MG tablet Take 1 tablet (500 mg total) by mouth 2 (two) times daily. 20 tablet 0  . ofloxacin (FLOXIN) 0.3 % otic solution Place 3 drops into the right ear 3 (three) times daily.     No current facility-administered medications for this visit.     Family History  Problem Relation Age of Onset  . Hypertension Father   . Lung cancer Maternal Grandmother   . Lung cancer Paternal Grandmother   . Alzheimer's disease Paternal Grandfather     ROS:  Pertinent items are noted in HPI.  Otherwise, a comprehensive ROS was negative.  Exam:   There were no vitals taken for this visit.   Ht Readings from Last 3 Encounters:  02/07/18 5\' 2"  (1.575 m)  11/23/17 5' 3.25" (1.607 m)  11/17/16 5' 2.75" (1.594 m)    General appearance:  alert, cooperative and appears stated age Head: Normocephalic, without obvious abnormality, atraumatic Neck: no adenopathy, supple, symmetrical, trachea midline and thyroid {EXAM; THYROID:18604} Lungs: clear to auscultation bilaterally Breasts: {Exam; breast:13139::"normal appearance, no masses or tenderness"} Heart: regular rate and rhythm Abdomen: soft, non-tender; no masses,  no organomegaly Extremities: extremities normal, atraumatic, no cyanosis or edema Skin: Skin color, texture, turgor normal. No rashes or lesions Lymph nodes: Cervical, supraclavicular, and axillary nodes normal. No abnormal inguinal nodes palpated Neurologic: Grossly normal   Pelvic: External genitalia:  no lesions              Urethra:  normal appearing urethra with no masses, tenderness or lesions              Bartholin's and Skene's: normal                 Vagina: normal appearing vagina with normal color and discharge, no lesions              Cervix: {exam; cervix:14595}              Pap taken: {yes no:314532} Bimanual Exam:  Uterus:  {exam; uterus:12215}              Adnexa: {exam; adnexa:12223}  Rectovaginal: Confirms               Anus:  normal sphincter tone, no lesions  Chaperone present: ***  A:  Well Woman with normal exam  P:   Reviewed health and wellness pertinent to exam  Pap smear: {YES NO:22349}  {plan; gyn:5269::"mammogram","pap smear","return annually or prn"}  An After Visit Summary was printed and given to the patient.

## 2018-12-10 ENCOUNTER — Ambulatory Visit: Payer: BC Managed Care – PPO | Admitting: Certified Nurse Midwife

## 2018-12-17 ENCOUNTER — Other Ambulatory Visit: Payer: Self-pay

## 2018-12-18 ENCOUNTER — Ambulatory Visit (INDEPENDENT_AMBULATORY_CARE_PROVIDER_SITE_OTHER): Payer: BC Managed Care – PPO

## 2018-12-18 ENCOUNTER — Encounter: Payer: Self-pay | Admitting: Obstetrics and Gynecology

## 2018-12-18 ENCOUNTER — Ambulatory Visit (INDEPENDENT_AMBULATORY_CARE_PROVIDER_SITE_OTHER): Payer: BC Managed Care – PPO | Admitting: Obstetrics and Gynecology

## 2018-12-18 VITALS — BP 132/82 | HR 60 | Temp 97.4°F | Wt 293.4 lb

## 2018-12-18 DIAGNOSIS — Z3009 Encounter for other general counseling and advice on contraception: Secondary | ICD-10-CM

## 2018-12-18 DIAGNOSIS — T8332XA Displacement of intrauterine contraceptive device, initial encounter: Secondary | ICD-10-CM | POA: Diagnosis not present

## 2018-12-18 DIAGNOSIS — R102 Pelvic and perineal pain: Secondary | ICD-10-CM | POA: Diagnosis not present

## 2018-12-18 LAB — POCT URINE PREGNANCY: Preg Test, Ur: NEGATIVE

## 2018-12-18 MED ORDER — NORETHIN ACE-ETH ESTRAD-FE 1-20 MG-MCG PO TABS: 1.0000 | ORAL_TABLET | Freq: Every day | ORAL | 0 refills | Status: DC

## 2018-12-18 NOTE — Progress Notes (Signed)
GYNECOLOGY  VISIT   HPI: 28 y.o.   Single Black or African American Not Hispanic or Latino  female   G0P0000 with No LMP recorded. (Menstrual status: IUD).   here for pelvic pain. Reports pain startedgradually 2 week ago and has been intermittent. Reports pain is cramping midline, ranges from a 3-7/10 in severity. Pain can be a little worse when she empties her bladder. No urinary frequency, urgency or pain. . Denies any sharp pain or bleeding. Normal BM's. No fevers, some nausea, no emesis.  She has a kyleena IUD, placed in 11/18. No cycles. Sexually active, same partner x 5 years, no dyspareunia.   GYNECOLOGIC HISTORY: No LMP recorded. (Menstrual status: IUD). Contraception: IUD Menopausal hormone therapy: None        OB History    Gravida  0   Para  0   Term  0   Preterm  0   AB  0   Living  0     SAB  0   TAB  0   Ectopic  0   Multiple  0   Live Births                 There are no problems to display for this patient.   Past Medical History:  Diagnosis Date  . Asthma    as a child  . Papilledema    Bilateral  . STD (sexually transmitted disease)    + chlamydia 3/16    Past Surgical History:  Procedure Laterality Date  . INTRAUTERINE DEVICE (IUD) INSERTION  11/2016   Kyleena   . TONSILLECTOMY  2004  . WISDOM TOOTH EXTRACTION      Current Outpatient Medications  Medication Sig Dispense Refill  . Levonorgestrel (KYLEENA) 19.5 MG IUD by Intrauterine route.    Marland Kitchen ofloxacin (FLOXIN) 0.3 % otic solution Place 3 drops into the right ear 3 (three) times daily.     No current facility-administered medications for this visit.     ALLERGIES: Patient has no known allergies.  Family History  Problem Relation Age of Onset  . Hypertension Father   . Lung cancer Maternal Grandmother   . Lung cancer Paternal Grandmother   . Alzheimer's disease Paternal Grandfather     Social History   Socioeconomic History  . Marital status: Single    Spouse name:  Not on file  . Number of children: 0  . Years of education: 70  . Highest education level: Not on file  Occupational History    Comment: Facilities manager  Tobacco Use  . Smoking status: Never Smoker  . Smokeless tobacco: Never Used  Substance and Sexual Activity  . Alcohol use: Yes    Alcohol/week: 0.0 - 1.0 standard drinks    Comment: occ  . Drug use: No  . Sexual activity: Yes    Partners: Male    Birth control/protection: I.U.D.  Other Topics Concern  . Not on file  Social History Narrative   Lives with roommate   Caffeine- 2 per week   Social Determinants of Health   Financial Resource Strain:   . Difficulty of Paying Living Expenses: Not on file  Food Insecurity:   . Worried About Charity fundraiser in the Last Year: Not on file  . Ran Out of Food in the Last Year: Not on file  Transportation Needs:   . Lack of Transportation (Medical): Not on file  . Lack of Transportation (Non-Medical): Not on file  Physical  Activity:   . Days of Exercise per Week: Not on file  . Minutes of Exercise per Session: Not on file  Stress:   . Feeling of Stress : Not on file  Social Connections:   . Frequency of Communication with Friends and Family: Not on file  . Frequency of Social Gatherings with Friends and Family: Not on file  . Attends Religious Services: Not on file  . Active Member of Clubs or Organizations: Not on file  . Attends Banker Meetings: Not on file  . Marital Status: Not on file  Intimate Partner Violence:   . Fear of Current or Ex-Partner: Not on file  . Emotionally Abused: Not on file  . Physically Abused: Not on file  . Sexually Abused: Not on file    Review of Systems  Constitutional: Negative.   HENT: Negative.   Eyes: Negative.   Respiratory: Negative.   Cardiovascular: Negative.   Gastrointestinal: Positive for nausea.  Genitourinary:       Pelvic pain  Musculoskeletal: Negative.   Skin: Negative.   Neurological: Positive for  dizziness.  Endo/Heme/Allergies: Negative.   Psychiatric/Behavioral: Negative.     PHYSICAL EXAMINATION:    BP 136/88 (BP Location: Right Arm, Patient Position: Sitting, Cuff Size: Large)   Pulse 60   Temp (!) 97.4 F (36.3 C) (Skin)   Wt 293 lb 6.4 oz (133.1 kg)   BMI 53.66 kg/m     General appearance: alert, cooperative and appears stated age Abdomen: soft, mildly tender in BLQ, most in the mid lower abdomen, no rebound, no guarding; non distended, no masses,  no organomegaly  Pelvic: External genitalia:  no lesions              Urethra:  normal appearing urethra with no masses, tenderness or lesions              Bartholins and Skenes: normal                 Vagina: normal appearing vagina with normal color and discharge, no lesions              Cervix: no cervical motion tenderness, no lesions and IUD string 5+ cm              Bimanual Exam:  Uterus:  anteverted, tender, mobile, not appreciably enlarged, exam limited by BMI              Adnexa: no masses, mild tenderness bilaterally.               Rectovaginal: Yes.  .  Confirms.              Anus:  normal sphincter tone, no lesions  Chaperone was present for exam.    ASSESSMENT Pelvic pain, suspect an ovarian cyst Has a kyleena IUD, long string  PLAN UPT negative Nuswab for GC/CT Pelvic ultrasound   Addendum: ultrasound shows the IUD in the LUS, resolving CL on the right.  The IUD was removed with ringed forceps. Discussed option for contraception, she would like to go on OCP's, no contraindications, risks reviewed. F/U pill check in 3 months Use condoms for the next week   Repeat BP was 132/82

## 2018-12-18 NOTE — Patient Instructions (Signed)

## 2018-12-19 LAB — GC/CHLAMYDIA PROBE AMP
Chlamydia trachomatis, NAA: NEGATIVE
Neisseria Gonorrhoeae by PCR: NEGATIVE

## 2019-01-07 ENCOUNTER — Ambulatory Visit: Payer: BC Managed Care – PPO | Admitting: Certified Nurse Midwife

## 2019-01-12 DIAGNOSIS — Z20828 Contact with and (suspected) exposure to other viral communicable diseases: Secondary | ICD-10-CM | POA: Diagnosis not present

## 2019-01-24 ENCOUNTER — Other Ambulatory Visit: Payer: Self-pay
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
## 2019-01-28 ENCOUNTER — Other Ambulatory Visit: Payer: Self-pay

## 2019-01-28 ENCOUNTER — Other Ambulatory Visit (HOSPITAL_COMMUNITY)
Admission: RE | Admit: 2019-01-28 | Discharge: 2019-01-28 | Disposition: A | Discharge status: Home / Self Care | Payer: BC Managed Care – PPO | Source: Ambulatory Visit | Attending: Certified Nurse Midwife | Admitting: Certified Nurse Midwife

## 2019-01-28 ENCOUNTER — Ambulatory Visit (INDEPENDENT_AMBULATORY_CARE_PROVIDER_SITE_OTHER): Payer: BC Managed Care – PPO | Admitting: Certified Nurse Midwife

## 2019-01-28 ENCOUNTER — Encounter: Payer: Self-pay | Admitting: Certified Nurse Midwife

## 2019-01-28 VITALS — BP 120/80 | Temp 97.1°F | Resp 16 | Ht 63.25 in | Wt 283.0 lb

## 2019-01-28 DIAGNOSIS — Z01419 Encounter for gynecological examination (general) (routine) without abnormal findings: Secondary | ICD-10-CM | POA: Diagnosis not present

## 2019-01-28 DIAGNOSIS — Z113 Encounter for screening for infections with a predominantly sexual mode of transmission: Secondary | ICD-10-CM

## 2019-01-28 DIAGNOSIS — Z30015 Encounter for initial prescription of vaginal ring hormonal contraceptive: Secondary | ICD-10-CM

## 2019-01-28 DIAGNOSIS — Z124 Encounter for screening for malignant neoplasm of cervix: Secondary | ICD-10-CM | POA: Insufficient documentation

## 2019-01-28 DIAGNOSIS — N912 Amenorrhea, unspecified: Secondary | ICD-10-CM

## 2019-01-28 LAB — POCT URINE PREGNANCY: Preg Test, Ur: NEGATIVE

## 2019-01-28 MED ORDER — ETONOGESTREL-ETHINYL ESTRADIOL 0.12-0.015 MG/24HR VA RING: 1.0000 | VAGINAL_RING | VAGINAL | 3 refills | Status: DC

## 2019-01-28 NOTE — Patient Instructions (Signed)
General topics  Next pap or exam is  due in 1 year Take a Women's multivitamin Take 1200 mg. of calcium daily - prefer dietary If any concerns in interim to call back  Breast Self-Awareness Practicing breast self-awareness may pick up problems early, prevent significant medical complications, and possibly save your life. By practicing breast self-awareness, you can become familiar with how your breasts look and feel and if your breasts are changing. This allows you to notice changes early. It can also offer you some reassurance that your breast health is good. One way to learn what is normal for your breasts and whether your breasts are changing is to do a breast self-exam. If you find a lump or something that was not present in the past, it is best to contact your caregiver right away. Other findings that should be evaluated by your caregiver include nipple discharge, especially if it is bloody; skin changes or reddening; areas where the skin seems to be pulled in (retracted); or new lumps and bumps. Breast pain is seldom associated with cancer (malignancy), but should also be evaluated by a caregiver. BREAST SELF-EXAM The best time to examine your breasts is 5 7 days after your menstrual period is over.  ExitCare Patient Information 2013 [employer].   Exercise to Stay Healthy Exercise helps you become and stay healthy. EXERCISE IDEAS AND TIPS Choose exercises that:  You enjoy.  Fit into your day. You do not need to exercise really hard to be healthy. You can do exercises at a slow or medium level and stay healthy. You can:  Stretch before and after working out.  Try yoga, Pilates, or tai chi.  Lift weights.  Walk fast, swim, jog, run, climb stairs, bicycle, dance, or rollerskate.  Take aerobic classes. Exercises that burn about 150 calories:  Running 1  miles in 15 minutes.  Playing volleyball for 45 to 60 minutes.  Washing and waxing a car for 45 to 60  minutes.  Playing touch football for 45 minutes.  Walking 1  miles in 35 minutes.  Pushing a stroller 1  miles in 30 minutes.  Playing basketball for 30 minutes.  Raking leaves for 30 minutes.  Bicycling 5 miles in 30 minutes.  Walking 2 miles in 30 minutes.  Dancing for 30 minutes.  Shoveling snow for 15 minutes.  Swimming laps for 20 minutes.  Walking up stairs for 15 minutes.  Bicycling 4 miles in 15 minutes.  Gardening for 30 to 45 minutes.  Jumping rope for 15 minutes.  Washing windows or floors for 45 to 60 minutes. Document Released: 01/22/2010 Document Revised: 03/14/2011 Document Reviewed: 01/22/2010 ExitCare Patient Information 2013 [employer].   Other topics ( that may be useful information):    Sexually Transmitted Disease Sexually transmitted disease (STD) refers to any infection that is passed from person to person during sexual activity. This may happen by way of saliva, semen, blood, vaginal mucus, or urine. Common STDs include:  Gonorrhea.  Chlamydia.  Syphilis.  HIV/AIDS.  Genital herpes.  Hepatitis B and C.  Trichomonas.  Human papillomavirus (HPV).  Pubic lice. CAUSES  An STD may be spread by bacteria, virus, or parasite. A person can get an STD by:  Sexual intercourse with an infected person.  Sharing sex toys with an infected person.  Sharing needles with an infected person.  Having intimate contact with the genitals, mouth, or rectal areas of an infected person. SYMPTOMS  Some people may not have any symptoms, but   they can still pass the infection to others. Different STDs have different symptoms. Symptoms include:  Painful or bloody urination.  Pain in the pelvis, abdomen, vagina, anus, throat, or eyes.  Skin rash, itching, irritation, growths, or sores (lesions). These usually occur in the genital or anal area.  Abnormal vaginal discharge.  Penile discharge in men.  Soft, flesh-colored skin growths in the  genital or anal area.  Fever.  Pain or bleeding during sexual intercourse.  Swollen glands in the groin area.  Yellow skin and eyes (jaundice). This is seen with hepatitis. DIAGNOSIS  To make a diagnosis, your caregiver may:  Take a medical history.  Perform a physical exam.  Take a specimen (culture) to be examined.  Examine a sample of discharge under a microscope.  Perform blood test TREATMENT   Chlamydia, gonorrhea, trichomonas, and syphilis can be cured with antibiotic medicine.  Genital herpes, hepatitis, and HIV can be treated, but not cured, with prescribed medicines. The medicines will lessen the symptoms.  Genital warts from HPV can be treated with medicine or by freezing, burning (electrocautery), or surgery. Warts may come back.  HPV is a virus and cannot be cured with medicine or surgery.However, abnormal areas may be followed very closely by your caregiver and may be removed from the cervix, vagina, or vulva through office procedures or surgery. If your diagnosis is confirmed, your recent sexual partners need treatment. This is true even if they are symptom-free or have a negative culture or evaluation. They should not have sex until their caregiver says it is okay. HOME CARE INSTRUCTIONS  All sexual partners should be informed, tested, and treated for all STDs.  Take your antibiotics as directed. Finish them even if you start to feel better.  Only take over-the-counter or prescription medicines for pain, discomfort, or fever as directed by your caregiver.  Rest.  Eat a balanced diet and drink enough fluids to keep your urine clear or pale yellow.  Do not have sex until treatment is completed and you have followed up with your caregiver. STDs should be checked after treatment.  Keep all follow-up appointments, Pap tests, and blood tests as directed by your caregiver.  Only use latex condoms and water-soluble lubricants during sexual activity. Do not use  petroleum jelly or oils.  Avoid alcohol and illegal drugs.  Get vaccinated for HPV and hepatitis. If you have not received these vaccines in the past, talk to your caregiver about whether one or both might be right for you.  Avoid risky sex practices that can break the skin. The only way to avoid getting an STD is to avoid all sexual activity.Latex condoms and dental dams (for oral sex) will help lessen the risk of getting an STD, but will not completely eliminate the risk. SEEK MEDICAL CARE IF:   You have a fever.  You have any new or worsening symptoms. Document Released: 03/12/2002 Document Revised: 03/14/2011 Document Reviewed: 03/19/2010 [facility] Patient Information 2013 [place].    Domestic Abuse You are being battered or abused if someone close to you hits, pushes, or physically hurts you in any way. You also are being abused if you are forced into activities. You are being sexually abused if you are forced to have sexual contact of any kind. You are being emotionally abused if you are made to feel worthless or if you are constantly threatened. It is important to remember that help is available. No one has the right to abuse you. PREVENTION OF FURTHER  ABUSE  Learn the warning signs of danger. This varies with situations but may include: the use of alcohol, threats, isolation from friends and family, or forced sexual contact. Leave if you feel that violence is going to occur.  If you are attacked or beaten, report it to the police so the abuse is documented. You do not have to press charges. The police can protect you while you or the attackers are leaving. Get the officer's name and badge number and a copy of the report.  Find someone you can trust and tell them what is happening to you: your caregiver, a nurse, clergy member, close friend or family member. Feeling ashamed is natural, but remember that you have done nothing wrong. No one deserves abuse. Document Released:  12/18/1999 Document Revised: 03/14/2011 Document Reviewed: 02/25/2010 ExitCare Patient Information 2013 [employer].    How Much is Too Much Alcohol? Drinking too much alcohol can cause injury, accidents, and health problems. These types of problems can include:   Car crashes.  Falls.  Family fighting (domestic violence).  Drowning.  Fights.  Injuries.  Burns.  Damage to certain organs.  Having a baby with birth defects. ONE DRINK CAN BE TOO MUCH WHEN YOU ARE:  Working.  Pregnant or breastfeeding.  Taking medicines. Ask your doctor.  Driving or planning to drive. If you or someone you know has a drinking problem, get help from a doctor.  Document Released: 10/16/2008 Document Revised: 03/14/2011 Document Reviewed: 10/16/2008 ExitCare Patient Information 2013 [employer].   Smoking Hazards Smoking cigarettes is extremely bad for your health. Tobacco smoke has over 200 known poisons in it. There are over 60 chemicals in tobacco smoke that cause cancer. Some of the chemicals found in cigarette smoke include:   Cyanide.  Benzene.  Formaldehyde.  Methanol (wood alcohol).  Acetylene (fuel used in welding torches).  Ammonia. Cigarette smoke also contains the poisonous gases nitrogen oxide and carbon monoxide.  Cigarette smokers have an increased risk of many serious medical problems and Smoking causes approximately:  90% of all lung cancer deaths in men.  80% of all lung cancer deaths in women.  90% of deaths from chronic obstructive lung disease. Compared with nonsmokers, smoking increases the risk of:  Coronary heart disease by 2 to 4 times.  Stroke by 2 to 4 times.  Men developing lung cancer by 23 times.  Women developing lung cancer by 13 times.  Dying from chronic obstructive lung diseases by 12 times.  . Smoking is the most preventable cause of death and disease in our society.  WHY IS SMOKING ADDICTIVE?  Nicotine is the chemical  agent in tobacco that is capable of causing addiction or dependence.  When you smoke and inhale, nicotine is absorbed rapidly into the bloodstream through your lungs. Nicotine absorbed through the lungs is capable of creating a powerful addiction. Both inhaled and non-inhaled nicotine may be addictive.  Addiction studies of cigarettes and spit tobacco show that addiction to nicotine occurs mainly during the teen years, when young people begin using tobacco products. WHAT ARE THE BENEFITS OF QUITTING?  There are many health benefits to quitting smoking.   Likelihood of developing cancer and heart disease decreases. Health improvements are seen almost immediately.  Blood pressure, pulse rate, and breathing patterns start returning to normal soon after quitting. QUITTING SMOKING   American Lung Association - 1-800-LUNGUSA  American Cancer Society - 1-800-ACS-2345 Document Released: 01/28/2004 Document Revised: 03/14/2011 Document Reviewed: 10/01/2008 ExitCare Patient Information 2013 ExitCare[employer]   LL[employer].   Stress Management Stress is a state of physical or mental tension that often results from changes in your life or normal routine. Some common causes of stress are:  Death of a loved one.  Injuries or severe illnesses.  Getting fired or changing jobs.  Moving into a new home. Other causes may be:  Sexual problems.  Business or financial losses.  Taking on a large debt.  Regular conflict with someone at home or at work.  Constant tiredness from lack of sleep. It is not just bad things that are stressful. It may be stressful to:  Win the lottery.  Get married.  Buy a new car. The amount of stress that can be easily tolerated varies from person to person. Changes generally cause stress, regardless of the types of change. Too much stress can affect your health. It may lead to physical or emotional problems. Too little stress (boredom) may also become stressful. SUGGESTIONS TO  REDUCE STRESS:  Talk things over with your family and friends. It often is helpful to share your concerns and worries. If you feel your problem is serious, you may want to get help from a professional counselor.  Consider your problems one at a time instead of lumping them all together. Trying to take care of everything at once may seem impossible. List all the things you need to do and then start with the most important one. Set a goal to accomplish 2 or 3 things each day. If you expect to do too many in a single day you will naturally fail, causing you to feel even more stressed.  Do not use alcohol or drugs to relieve stress. Although you may feel better for a short time, they do not remove the problems that caused the stress. They can also be habit forming.  Exercise regularly - at least 3 times per week. Physical exercise can help to relieve that "uptight" feeling and will relax you.  The shortest distance between despair and hope is often a good night's sleep.  Go to bed and get up on time allowing yourself time for appointments without being rushed.  Take a short "time-out" period from any stressful situation that occurs during the day. Close your eyes and take some deep breaths. Starting with the muscles in your face, tense them, hold it for a few seconds, then relax. Repeat this with the muscles in your neck, shoulders, hand, stomach, back and legs.  Take good care of yourself. Eat a balanced diet and get plenty of rest.  Schedule time for having fun. Take a break from your daily routine to relax. HOME CARE INSTRUCTIONS   Call if you feel overwhelmed by your problems and feel you can no longer manage them on your own.  Return immediately if you feel like hurting yourself or someone else. Document Released: 06/15/2000 Document Revised: 03/14/2011 Document Reviewed: 02/05/2007 [facility] Patient Information 2013 [place].  Ethinyl Estradiol; Etonogestrel vaginal ring What is  this medicine? ETHINYL ESTRADIOL; ETONOGESTREL (ETH in il es tra DYE ole; et oh noe JES trel) vaginal ring is a flexible, vaginal ring used as a contraceptive (birth control method). This medicine combines 2 types of female hormones, an estrogen and a progestin. This ring is used to prevent ovulation and pregnancy. Each ring is effective for 1 month. This medicine may be used for other purposes; ask your health care provider or pharmacist if you have questions. COMMON BRAND NAME(S): EluRyng, NuvaRing What should I tell my health  care provider before I take this medicine? They need to know if you have any of these conditions:  abnormal vaginal bleeding  blood vessel disease or blood clots  breast, cervical, endometrial, ovarian, liver, or uterine cancer  diabetes  gallbladder disease  having surgery  heart disease or recent heart attack  high blood pressure  high cholesterol or triglycerides  history of irregular heartbeat or heart valve problems  kidney disease  liver disease  migraine headaches  protein C deficiency  protein S deficiency  recently had a baby, miscarriage, or abortion  stroke  systemic lupus erythematosus (SLE)  tobacco smoker  your age is more than 28 years old  an unusual or allergic reaction to estrogens, progestins, other medicines, foods, dyes, or preservatives  pregnant or trying to get pregnant  breast-feeding How should I use this medicine? Insert the ring into your vagina as directed. Follow the directions on the prescription label. The ring will remain place for 3 weeks and is then removed for a 1-week break. A new ring is inserted 1 week after the last ring was removed, on the same day of the week. Check often to make sure the ring is still in place. If the ring was out of the vagina for an unknown amount of time, you may not be protected from pregnancy. Perform a pregnancy test and call your doctor. Do not use more often than  directed. A patient package insert for the product will be given with each prescription and refill. Read this sheet carefully each time. The sheet may change frequently. Contact your pediatrician regarding the use of this medicine in children. Special care may be needed. Overdosage: If you think you have taken too much of this medicine contact a poison control center or emergency room at once. NOTE: This medicine is only for you. Do not share this medicine with others. What if I miss a dose? You will need to use the ring exactly as directed. It is very important to follow the schedule every cycle. If you do not use the ring as directed, you may not be protected from pregnancy. If the ring should slip out, is lost, or if you leave it in longer or shorter than you should, contact your health care professional for advice. What may interact with this medicine? Do not take this medicine with the following medications:  dasabuvir; ombitasvir; paritaprevir; ritonavir  ombitasvir; paritaprevir; ritonavir  vaginal lubricants or other vaginal products that are oil-based or silicone-based This medicine may also interact with the following medications:  acetaminophen  antibiotics or medicines for infections, especially rifampin, rifabutin, rifapentine, and griseofulvin, and possibly penicillins or tetracyclines  aprepitant or fosaprepitant  armodafinil  ascorbic acid (vitamin C)  barbiturate medicines, such as phenobarbital or primidone  bosentan  certain antiviral medicines for hepatitis, HIV or AIDS  certain medicines for cancer treatment  certain medicines for seizures like carbamazepine, clobazam, felbamate, lamotrigine, oxcarbazepine, phenytoin, rufinamide, topiramate  certain medicines for treating high cholesterol  cyclosporine  dantrolene  elagolix  flibanserin  grapefruit juice  lesinurad  medicines for diabetes  medicines to treat fungal infections, such as  griseofulvin, miconazole, fluconazole, ketoconazole, itraconazole, posaconazole or voriconazole  mifepristone  mitotane  modafinil  morphine  mycophenolate  St. John's wort  tamoxifen  temazepam  theophylline or aminophylline  thyroid hormones  tizanidine  tranexamic acid  ulipristal  warfarin This list may not describe all possible interactions. Give your health care provider a list of all the medicines, herbs,  non-prescription drugs, or dietary supplements you use. Also tell them if you smoke, drink alcohol, or use illegal drugs. Some items may interact with your medicine. What should I watch for while using this medicine? Visit your doctor or health care professional for regular checks on your progress. You will need a regular breast and pelvic exam and Pap smear while on this medicine. Check with your doctor or health care professional to see if you need an additional method of contraception during the first cycle that you use this ring. Female condoms (made with natural rubber latex, polyisoprene, and polyurethane) and spermicides may be used. Do not use a diaphragm, cervical cap, or a female condom, as the ring can interfere with these birth control methods and their proper placement. If you have any reason to think you are pregnant, stop using this medicine right away and contact your doctor or health care professional. If you are using this medicine for hormone related problems, it may take several cycles of use to see improvement in your condition. Smoking increases the risk of getting a blood clot or having a stroke while you are using hormonal birth control, especially if you are more than 28 years old. You are strongly advised not to smoke. Some women are prone to getting dark patches on the skin of the face (cholasma). Your risk of getting chloasma with this medicine is higher if you had chloasma during a pregnancy. Keep out of the sun. If you cannot avoid being in the  sun, wear protective clothing and use sunscreen. Do not use sun lamps or tanning beds/booths. This medicine can make your body retain fluid, making your fingers, hands, or ankles swell. Your blood pressure can go up. Contact your doctor or health care professional if you feel you are retaining fluid. If you are going to have elective surgery, you may need to stop using this medicine before the surgery. Consult your health care professional for advice. This medicine does not protect you against HIV infection (AIDS) or any other sexually transmitted diseases. What side effects may I notice from receiving this medicine? Side effects that you should report to your doctor or health care professional as soon as possible:  allergic reactions such as skin rash or itching, hives, swelling of the lips, mouth, tongue, or throat  depression  high blood pressure  migraines or severe, sudden headaches  signs and symptoms of a blood clot such as breathing problems; changes in vision; chest pain; severe, sudden headache; pain, swelling, warmth in the leg; trouble speaking; sudden numbness or weakness of the face, arm or leg  signs and symptoms of infection like fever or chills with dizziness and a sunburn-like rash, or pain or trouble passing urine  stomach pain  symptoms of vaginal infection like itching, irritation or unusual discharge  yellowing of the eyes or skin Side effects that usually do not require medical attention (report these to your doctor or health care professional if they continue or are bothersome):  acne  breast pain, tenderness  irregular vaginal bleeding or spotting, particularly during the first month of use  mild headache  nausea  painful periods  vomiting This list may not describe all possible side effects. Call your doctor for medical advice about side effects. You may report side effects to FDA at 1-800-FDA-1088. Where should I keep my medicine? Keep out of the  reach of children. Store unopened medicine for up to 4 months at room temperature at 15 and 30 degrees C (  59 and 86 degrees F). Protect from light. Do not store above 30 degrees C (86 degrees F). Throw away any unused medicine 4 months after the dispense date or the expiration date, whichever comes first. A ring may only be used for 1 cycle (1 month). After the 3-week cycle, a used ring is removed and should be placed in the re-closable foil pouch and discarded in the trash out of reach of children and pets. Do NOT flush down the toilet. NOTE: This sheet is a summary. It may not cover all possible information. If you have questions about this medicine, talk to your doctor, pharmacist, or health care provider.  2020 [employer] Standard (2018-07-12 12:31:47)

## 2019-01-29 LAB — VAGINITIS/VAGINOSIS, DNA PROBE
Candida Species: NEGATIVE
Gardnerella vaginalis: NEGATIVE
Trichomonas vaginosis: NEGATIVE

## 2019-01-30 LAB — CYTOLOGY - PAP: Diagnosis: NEGATIVE

## 2019-02-06 DIAGNOSIS — Z20828 Contact with and (suspected) exposure to other viral communicable diseases: Secondary | ICD-10-CM | POA: Diagnosis not present

## 2019-02-26 ENCOUNTER — Telehealth: Payer: BC Managed Care – PPO | Admitting: Physician Assistant

## 2019-02-26 DIAGNOSIS — R1110 Vomiting, unspecified: Secondary | ICD-10-CM

## 2019-02-26 MED ORDER — ONDANSETRON 4 MG PO TBDP: ORAL_TABLET | ORAL | 0 refills | Status: DC

## 2019-02-26 NOTE — Progress Notes (Signed)
We are sorry that you are not feeling well. Here is how we plan to help!  Based on what you have shared with me it looks like you have vomiting associated with your migraine headache.  These can often be triggered by new medications or oral contraceptives.  Please discuss with your prescribing provider. In the meantime, I will prescribe Zofran to stop the vomiting.  Vomiting is the forceful emptying of a portion of the stomach's content through the mouth.  Although nausea and vomiting can make you feel miserable, it's important to remember that these are not diseases, but rather symptoms of an underlying illness.  When we treat short term symptoms, we always caution that any symptoms that persist should be fully evaluated in a medical office.  I have prescribed a medication that will help alleviate your symptoms and allow you to stay hydrated:  Zofran 4 mg 1 tablet every 8 hours as needed for nausea and vomiting  HOME CARE:  Drink clear liquids.  This is very important! Dehydration (the lack of fluid) can lead to a serious complication.  Start off with 1 tablespoon every 5 minutes for 8 hours.  You may begin eating bland foods after 8 hours without vomiting.  Start with saltine crackers, white bread, rice, mashed potatoes, applesauce.  After 48 hours on a bland diet, you may resume a normal diet.  Try to go to sleep.  Sleep often empties the stomach and relieves the need to vomit.  GET HELP RIGHT AWAY IF:   Your symptoms do not improve or worsen within 2 days after treatment.  You have a fever for over 3 days.  You cannot keep down fluids after trying the medication.  MAKE SURE YOU:   Understand these instructions.  Will watch your condition.  Will get help right away if you are not doing well or get worse.   Thank you for choosing an e-visit. Your e-visit answers were reviewed by a board certified advanced clinical practitioner to complete your personal care plan. Depending  upon the condition, your plan could have included both over the counter or prescription medications. Please review your pharmacy choice. Be sure that the pharmacy you have chosen is open so that you can pick up your prescription now.  If there is a problem you may message your provider in MyChart to have the prescription routed to another pharmacy. Your safety is important to [place]. If you have drug allergies check your prescription carefully.  For the next 24 hours, you can use MyChart to ask questions about today's visit, request a non-urgent call back, or ask for a work or school excuse from your e-visit provider. You will get an e-mail in the next two days asking about your experience. I hope that your e-visit has been valuable and will speed your recovery.  Greater than 5 minutes, yet less than 10 minutes of time have been spent researching, coordinating, and implementing care for this patient today

## 2019-03-13 DIAGNOSIS — Z20828 Contact with and (suspected) exposure to other viral communicable diseases: Secondary | ICD-10-CM | POA: Diagnosis not present

## 2019-03-18 ENCOUNTER — Encounter: Payer: Self-pay | Admitting: Certified Nurse Midwife

## 2019-03-18 ENCOUNTER — Ambulatory Visit: Payer: BC Managed Care – PPO | Admitting: Obstetrics and Gynecology

## 2019-03-20 ENCOUNTER — Encounter: Payer: Self-pay | Admitting: Certified Nurse Midwife

## 2019-03-28 ENCOUNTER — Other Ambulatory Visit: Payer: Self-pay

## 2019-03-29 ENCOUNTER — Ambulatory Visit: Payer: BC Managed Care – PPO | Admitting: Obstetrics and Gynecology

## 2019-07-04 ENCOUNTER — Ambulatory Visit
Admission: EM | Admit: 2019-07-04 | Discharge: 2019-07-04 | Disposition: A | Discharge status: Home / Self Care | Payer: BC Managed Care – PPO

## 2019-07-04 ENCOUNTER — Encounter: Payer: Self-pay | Admitting: Emergency Medicine

## 2019-07-04 DIAGNOSIS — H66001 Acute suppurative otitis media without spontaneous rupture of ear drum, right ear: Secondary | ICD-10-CM

## 2019-07-04 MED ORDER — AMOXICILLIN 875 MG PO TABS: 875.0000 mg | ORAL_TABLET | Freq: Two times a day (BID) | ORAL | 0 refills | Status: AC

## 2019-07-04 NOTE — Discharge Instructions (Signed)
Start amoxicillin as directed. Follow up with ENT for recheck.

## 2019-07-04 NOTE — ED Provider Notes (Signed)
[facility]    CSN: [id number] Arrival date & time: 07/04/19  1913      History   Chief Complaint Chief Complaint  Patient presents with   Ear Problem    HPI Anita Patton is a 29 y.o. female.   29 year old female comes in for right ear pain for the past 2 days.  States had " whooshing" sound in ear last week, and started using old FQ ear drops without relief.  Now with pain to the right ear, occasional sharp pains.  Denies URI symptoms, fever.  Denies drainage to the ear.  Had PE tube placed 5 years ago, has not needed ENT follow up since     Past Medical History:  Diagnosis Date   Asthma    as a child   Papilledema    Bilateral   STD (sexually transmitted disease)    + chlamydia 3/16    There are no problems to display for this patient.   Past Surgical History:  Procedure Laterality Date   INTRAUTERINE DEVICE (IUD) INSERTION  11/2016   [facility]    MYRINGOTOMY Right    TONSILLECTOMY  2004   WISDOM TOOTH EXTRACTION      OB History    Gravida  0   Para  0   Term  0   Preterm  0   AB  0   Living  0     SAB  0   TAB  0   Ectopic  0   Multiple  0   Live Births               Home Medications    Prior to Admission medications   Medication Sig Start Date End Date Taking? Authorizing Provider  ibuprofen (ADVIL) 200 MG tablet Take 200 mg by mouth every 6 (six) hours as needed.   Yes [provider]  ofloxacin (FLOXIN) 0.3 % otic solution Place 3 drops into the right ear 3 (three) times daily.   Yes [provider]  amoxicillin (AMOXIL) 875 MG tablet Take 1 tablet (875 mg total) by mouth 2 (two) times daily. 07/04/19   Belinda Fisher, PA-C  etonogestrel-ethinyl estradiol (NUVARING) 0.12-0.015 MG/24HR vaginal ring Place 1 each vaginally every 28 (twenty-eight) days. Insert vaginally and leave in place for 3 consecutive weeks, then remove for 1 week. 01/28/19 07/04/19  Verner Chol, CNM    Family  History Family History  Problem Relation Age of Onset   Hypertension Father    Lung cancer Maternal Grandmother    Lung cancer Paternal Grandmother    Alzheimer's disease Paternal Grandfather     Social History Social History   Tobacco Use   Smoking status: Never Smoker   Smokeless tobacco: Never Used  Building services engineer Use: Never used  Substance Use Topics   Alcohol use: Yes    Alcohol/week: 0.0 - 1.0 standard drinks    Comment: occ   Drug use: No     Allergies   Patient has no known allergies.   Review of Systems Review of Systems  Reason unable to perform ROS: See HPI as above.     Physical Exam Triage Vital Signs ED Triage Vitals  Enc Vitals Group     BP 07/04/19 1921 136/86     Pulse Rate 07/04/19 1921 81     Resp 07/04/19 1921 18     Temp 07/04/19 1921 98.3 F (36.8 C)     Temp Source  07/04/19 1921 Oral     SpO2 07/04/19 1921 98 %     Weight --      Height --      Head Circumference --      Peak Flow --      Pain Score 07/04/19 1923 7     Pain Loc --      Pain Edu? --      Excl. in GC? --    No data found.  Updated Vital Signs BP 136/86 (BP Location: Left Arm)    Pulse 81    Temp 98.3 F (36.8 C) (Oral)    Resp 18    LMP 06/04/2019    SpO2 98%   Physical Exam Constitutional:      General: She is not in acute distress.    Appearance: Normal appearance. She is well-developed. She is not toxic-appearing or diaphoretic.  HENT:     Head: Normocephalic and atraumatic.     Left Ear: Tympanic membrane, ear canal and external ear normal. Tympanic membrane is not erythematous or bulging.     Ears:     Comments: No tragal tenderness. Right ear canal normal without swelling, erythema, drainage. TM visible with PE tube to 5 o'clock region. PE tube clogged, unsure if still attached. Rest of TM erythematous with bulging. No active drainage.  Eyes:     Conjunctiva/sclera: Conjunctivae normal.     Pupils: Pupils are equal, round, and reactive to  light.  Pulmonary:     Effort: Pulmonary effort is normal. No respiratory distress.     Comments: Speaking in full sentences without difficulty Musculoskeletal:     Cervical back: Normal range of motion and neck supple.  Skin:    General: Skin is warm and dry.  Neurological:     Mental Status: She is alert and oriented to person, place, and time.    UC Treatments / Results  Labs (all labs ordered are listed, but only abnormal results are displayed) Labs Reviewed - No data to display  EKG   Radiology No results found.  Procedures Procedures (including critical care time)  Medications Ordered in UC Medications - No data to display  Initial Impression / Assessment and Plan / UC Course  I have reviewed the triage vital signs and the nursing notes.  Pertinent labs & imaging results that were available during my care of the patient were reviewed by me and considered in my medical decision making (see chart for details).     Amoxicillin as directed for otitis media.  Discussed with patient, exam unsure if PE tube still attached to TM.  To follow-up with ENT for further evaluation and management needed.  Patient expresses understanding and agrees to plan.  Final Clinical Impressions(s) / UC Diagnoses   Final diagnoses:  Non-recurrent acute suppurative otitis media of right ear without spontaneous rupture of tympanic membrane   ED Prescriptions    Medication Sig Dispense Auth. Provider   amoxicillin (AMOXIL) 875 MG tablet Take 1 tablet (875 mg total) by mouth 2 (two) times daily. 14 tablet Belinda Fisher, PA-C     PDMP not reviewed this encounter.   Belinda Fisher, PA-C 07/04/19 1955

## 2019-07-04 NOTE — ED Triage Notes (Signed)
Patient reports have a tube in right ear for 5 years.  Patient reports "whooshy" sound in ear, patient used ear drops 2-3 times last week.  The last 2 days, ear has been hurting

## 2020-01-29 ENCOUNTER — Ambulatory Visit: Payer: BC Managed Care – PPO | Admitting: Certified Nurse Midwife

## 2020-04-21 ENCOUNTER — Other Ambulatory Visit (HOSPITAL_COMMUNITY)
Admission: RE | Admit: 2020-04-21 | Discharge: 2020-04-21 | Disposition: A | Discharge status: Home / Self Care | Payer: 59 | Source: Ambulatory Visit | Attending: Nurse Practitioner | Admitting: Nurse Practitioner

## 2020-04-21 DIAGNOSIS — N898 Other specified noninflammatory disorders of vagina: Secondary | ICD-10-CM | POA: Diagnosis present

## 2020-04-27 LAB — MOLECULAR ANCILLARY ONLY
Bacterial Vaginitis (gardnerella): NEGATIVE
Candida Glabrata: NEGATIVE
Candida Vaginitis: NEGATIVE
Chlamydia: NEGATIVE
Comment: NEGATIVE
Comment: NEGATIVE
Comment: NEGATIVE
Comment: NEGATIVE
Comment: NEGATIVE
Comment: NEGATIVE
Comment: NORMAL
HSV1: NEGATIVE
HSV2: NEGATIVE
Neisseria Gonorrhea: NEGATIVE
Trichomonas: NEGATIVE

## 2021-03-10 ENCOUNTER — Encounter (HOSPITAL_BASED_OUTPATIENT_CLINIC_OR_DEPARTMENT_OTHER): Payer: Self-pay | Admitting: Emergency Medicine

## 2021-03-10 ENCOUNTER — Emergency Department (HOSPITAL_BASED_OUTPATIENT_CLINIC_OR_DEPARTMENT_OTHER)
Admission: EM | Admit: 2021-03-10 | Discharge: 2021-03-10 | Disposition: A | Discharge status: Home / Self Care | Payer: BC Managed Care – PPO | Attending: Emergency Medicine | Admitting: Emergency Medicine

## 2021-03-10 ENCOUNTER — Emergency Department (HOSPITAL_BASED_OUTPATIENT_CLINIC_OR_DEPARTMENT_OTHER): Payer: BC Managed Care – PPO

## 2021-03-10 ENCOUNTER — Other Ambulatory Visit: Payer: Self-pay

## 2021-03-10 DIAGNOSIS — R1010 Upper abdominal pain, unspecified: Secondary | ICD-10-CM | POA: Diagnosis present

## 2021-03-10 DIAGNOSIS — R110 Nausea: Secondary | ICD-10-CM | POA: Insufficient documentation

## 2021-03-10 DIAGNOSIS — R1013 Epigastric pain: Secondary | ICD-10-CM | POA: Diagnosis not present

## 2021-03-10 DIAGNOSIS — R1012 Left upper quadrant pain: Secondary | ICD-10-CM | POA: Insufficient documentation

## 2021-03-10 HISTORY — DX: Autoimmune thyroiditis: E06.3

## 2021-03-10 LAB — URINALYSIS, ROUTINE W REFLEX MICROSCOPIC
Bilirubin Urine: NEGATIVE
Glucose, UA: NEGATIVE mg/dL
Ketones, ur: NEGATIVE mg/dL
Nitrite: NEGATIVE
Specific Gravity, Urine: 1.024 (ref 1.005–1.030)
pH: 6 (ref 5.0–8.0)

## 2021-03-10 LAB — CBC
HCT: 41.5 % (ref 36.0–46.0)
Hemoglobin: 13.1 g/dL (ref 12.0–15.0)
MCH: 26.6 pg (ref 26.0–34.0)
MCHC: 31.6 g/dL (ref 30.0–36.0)
MCV: 84.3 fL (ref 80.0–100.0)
Platelets: 287 10*3/uL (ref 150–400)
RBC: 4.92 MIL/uL (ref 3.87–5.11)
RDW: 12.7 % (ref 11.5–15.5)
WBC: 8 10*3/uL (ref 4.0–10.5)
nRBC: 0 % (ref 0.0–0.2)

## 2021-03-10 LAB — COMPREHENSIVE METABOLIC PANEL
ALT: 28 U/L (ref 0–44)
AST: 25 U/L (ref 15–41)
Albumin: 4.4 g/dL (ref 3.5–5.0)
Alkaline Phosphatase: 53 U/L (ref 38–126)
Anion gap: 10 (ref 5–15)
BUN: 12 mg/dL (ref 6–20)
CO2: 22 mmol/L (ref 22–32)
Calcium: 9.2 mg/dL (ref 8.9–10.3)
Chloride: 107 mmol/L (ref 98–111)
Creatinine, Ser: 0.72 mg/dL (ref 0.44–1.00)
GFR, Estimated: >60 mL/min (ref >60)
Glucose, Bld: 91 mg/dL (ref 70–99)
Potassium: 4.2 mmol/L (ref 3.5–5.1)
Sodium: 139 mmol/L (ref 135–145)
Total Bilirubin: 0.5 mg/dL (ref 0.3–1.2)
Total Protein: 7 g/dL (ref 6.5–8.1)

## 2021-03-10 LAB — PREGNANCY, URINE: Preg Test, Ur: NEGATIVE

## 2021-03-10 LAB — LIPASE, BLOOD: Lipase: <10 U/L — ABNORMAL LOW (ref 11–51)

## 2021-03-10 MED ORDER — SUCRALFATE 1 GM/10ML PO SUSP: 1.0000 g | Freq: Three times a day (TID) | ORAL | 0 refills | Status: DC

## 2021-03-10 MED ORDER — SUCRALFATE 1 G PO TABS: 1.0000 g | ORAL_TABLET | Freq: Three times a day (TID) | ORAL | 0 refills | Status: AC

## 2021-03-10 MED ORDER — PANTOPRAZOLE SODIUM 40 MG PO TBEC: 40.0000 mg | DELAYED_RELEASE_TABLET | Freq: Every day | ORAL | 0 refills | Status: AC

## 2021-03-10 MED ORDER — ALUM & MAG HYDROXIDE-SIMETH 200-200-20 MG/5ML PO SUSP
30.0000 mL | Freq: Once | ORAL | Status: AC
Start: 2021-03-10 — End: 2021-03-10
  Administered 2021-03-10: 30 mL via ORAL
  Filled 2021-03-10: qty 30

## 2021-03-10 NOTE — ED Provider Notes (Signed)
?[facility] [facility] ?Provider Note ? ? ?CSN: [id number] ?Arrival date & time: 03/10/21  0948 ? ?  ? ?History ? ?Chief Complaint  ?Patient presents with  ? Abdominal Pain  ? ? ?Anita Patton is a 31 y.o. female. ? ?HPI ?31 year old female with a recent diagnosis of Hashimoto's disease about 2 months ago presents with abdominal pain.  Has been ongoing for about 3 days.  It seems to come and go though she cannot correlate it with anything specific.  No fevers, vomiting, diarrhea.  She has had some nausea and had a couple extra bowel movements after she tried probiotics but otherwise has had normal bowel regimen.  No urinary symptoms or vaginal symptoms.  No back or chest pain.  Feels like an aching sensation in her upper abdomen.  She states she recently started a new diet for the thyroid disease and she wonders if this could be playing a role.  Pain at its worst is about a 6 out of 10.  ? ?Home Medications ?Prior to Admission medications   ?Medication Sig Start Date End Date Taking? Authorizing Provider  ?pantoprazole (PROTONIX) 40 MG tablet Take 1 tablet (40 mg total) by mouth daily. 03/10/21  Yes Sherwood Gambler, MD  ?sucralfate (CARAFATE) 1 GM/10ML suspension Take 10 mLs (1 g total) by mouth 4 (four) times daily -  with meals and at bedtime. 03/10/21  Yes Sherwood Gambler, MD  ?amoxicillin (AMOXIL) 875 MG tablet Take 1 tablet (875 mg total) by mouth 2 (two) times daily. 07/04/19   Tasia Catchings, Amy V, PA-C  ?ibuprofen (ADVIL) 200 MG tablet Take 200 mg by mouth every 6 (six) hours as needed.    [provider]  ?ofloxacin (FLOXIN) 0.3 % otic solution Place 3 drops into the right ear 3 (three) times daily.    [provider]  ?etonogestrel-ethinyl estradiol (NUVARING) 0.12-0.015 MG/24HR vaginal ring Place 1 each vaginally every 28 (twenty-eight) days. Insert vaginally and leave in place for 3 consecutive weeks, then remove for 1 week. 01/28/19 07/04/19  Regina Eck, CNM  ?    ? ?Allergies    ?Patient has no known allergies.   ? ?Review of Systems   ?Review of Systems  ?Constitutional:  Negative for fever.  ?Respiratory:  Negative for shortness of breath.   ?Cardiovascular:  Negative for chest pain.  ?Gastrointestinal:  Positive for abdominal pain and nausea. Negative for diarrhea and vomiting.  ?Genitourinary:  Negative for dysuria, vaginal bleeding, vaginal discharge and vaginal pain.  ?Musculoskeletal:  Negative for back pain.  ? ?Physical Exam ?Updated Vital Signs ?BP 117/76   Pulse 68   Temp 98 ?F (36.7 ?C)   Resp 17   Ht 5\' 2"  (1.575 m)   Wt 136.1 kg   SpO2 100%   BMI 54.87 kg/m?  ?Physical Exam ?Vitals and nursing note reviewed.  ?Constitutional:   ?   General: She is not in acute distress. ?   Appearance: She is well-developed. She is obese. She is not ill-appearing or diaphoretic.  ?HENT:  ?   Head: Normocephalic and atraumatic.  ?Cardiovascular:  ?   Rate and Rhythm: Normal rate and regular rhythm.  ?   Heart sounds: Normal heart sounds.  ?Pulmonary:  ?   Effort: Pulmonary effort is normal.  ?   Breath sounds: Normal breath sounds.  ?Abdominal:  ?   Palpations: Abdomen is soft.  ?   Tenderness: There is abdominal tenderness in the epigastric area and left upper quadrant.  ?  Skin: ?   General: Skin is warm and dry.  ?Neurological:  ?   Mental Status: She is alert.  ? ? ?ED Results / Procedures / Treatments   ?Labs ?(all labs ordered are listed, but only abnormal results are displayed) ?Labs Reviewed  ?LIPASE, BLOOD - Abnormal; Notable for the following components:  ?    Result Value  ? Lipase <10 (*)   ? All other components within normal limits  ?URINALYSIS, ROUTINE W REFLEX MICROSCOPIC - Abnormal; Notable for the following components:  ? APPearance HAZY (*)   ? Hgb urine dipstick TRACE (*)   ? Protein, ur TRACE (*)   ? Leukocytes,Ua SMALL (*)   ? Bacteria, UA MANY (*)   ? All other components within normal limits  ?COMPREHENSIVE METABOLIC PANEL  ?CBC  ?PREGNANCY, URINE   ? ? ?EKG ?None ? ?Radiology ?US Abdomen Limited RUQ (LIVER/GB) ? ?Result Date: 03/10/2021 ?CLINICAL DATA:  Right upper quadrant abdominal pain for 3 days. EXAM: ULTRASOUND ABDOMEN LIMITED RIGHT UPPER QUADRANT COMPARISON:  None. FINDINGS: Gallbladder: No gallstones or wall thickening visualized. No sonographic Murphy sign noted by sonographer. Common bile duct: Diameter: 3.7 mm Liver: Normal echogenicity without focal lesion or biliary dilatation. Portal vein is patent on color Doppler imaging with normal direction of blood flow towards the liver. Other: None. IMPRESSION: Unremarkable right upper quadrant ultrasound examination. Electronically Signed   By: Marijo Sanes M.D.   On: 03/10/2021 10:59   ? ?Procedures ?Procedures  ? ? ?Medications Ordered in ED ?Medications  ?alum & mag hydroxide-simeth (MAALOX/MYLANTA) 200-200-20 MG/5ML suspension 30 mL (30 mLs Oral Given 03/10/21 1019)  ? ? ?ED Course/ Medical Decision Making/ A&P ?  ?                        ?Medical Decision Making ?Amount and/or Complexity of Data Reviewed ?Labs: ordered. ?Radiology: ordered. ? ?Risk ?OTC drugs. ?Prescription drug management. ? ? ?Patient has some mild tenderness but otherwise a benign exam.  No urinary/vaginal symptoms.  I overall suspect she might have some PUD or gastritis.  Her lab work has been reviewed and is unremarkable.  Her urinalysis shows many bacteria but she has no urinary symptoms and this is contaminated by squamous epithelial cells.  Doubt acute UTI.  LFTs, lipase, WBC are all normal.  Given the pain is all upper have pretty low suspicion for appendicitis, torsion, etc.  Right upper quadrant ultrasound images were reviewed and there is no obvious gallstones or abnormalities.  Given all this, will treat as possible gastritis and have her follow-up with her PCP.  Given return precautions. ? ? ? ? ? ? ? ?Final Clinical Impression(s) / ED Diagnoses ?Final diagnoses:  ?Upper abdominal pain  ? ? ?Rx / DC Orders ?ED Discharge  Orders   ? ?      Ordered  ?  pantoprazole (PROTONIX) 40 MG tablet  Daily       ? 03/10/21 1108  ?  sucralfate (CARAFATE) 1 GM/10ML suspension  3 times daily with meals & bedtime       ? 03/10/21 1108  ? ?  ?  ? ?  ? ? ?  ?Sherwood Gambler, MD ?03/10/21 1118 ? ?

## 2021-03-10 NOTE — Discharge Instructions (Addendum)
If you develop worsening, continued, or recurrent abdominal pain, uncontrolled vomiting, fever, chest or back pain, or any other new/concerning symptoms then return to the ER for evaluation.  

## 2021-03-10 NOTE — ED Triage Notes (Addendum)
Abd pain since Monday, last BM was  yesterday and it was normal, missed her period last month has hx of hashimotos disease, denies vag d/c and dysuria ?

## 2022-07-25 IMAGING — US US ABDOMEN LIMITED
1 series · 14 of 25 positions shown · non-contrast
Comparison: None.

CLINICAL DATA: Right upper quadrant abdominal pain for 3 days.

EXAM:
ULTRASOUND ABDOMEN LIMITED RIGHT UPPER QUADRANT

[Series 1: us abdomen limited ruq (liver/gb) · 14 of 35 slices shown]
[im 1/35]
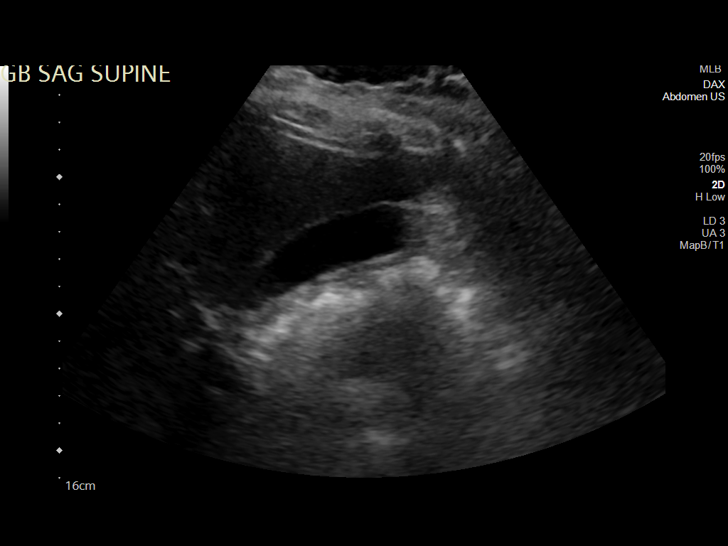
[im 3/35]
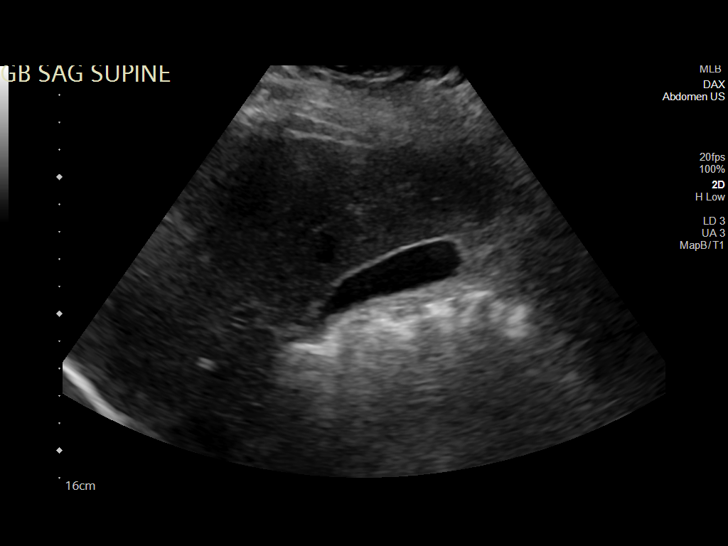
[im 6/35]
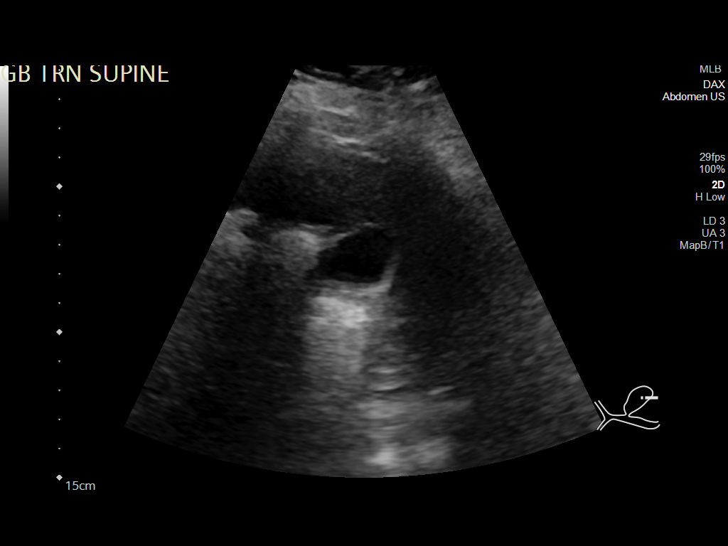
[im 9/35]
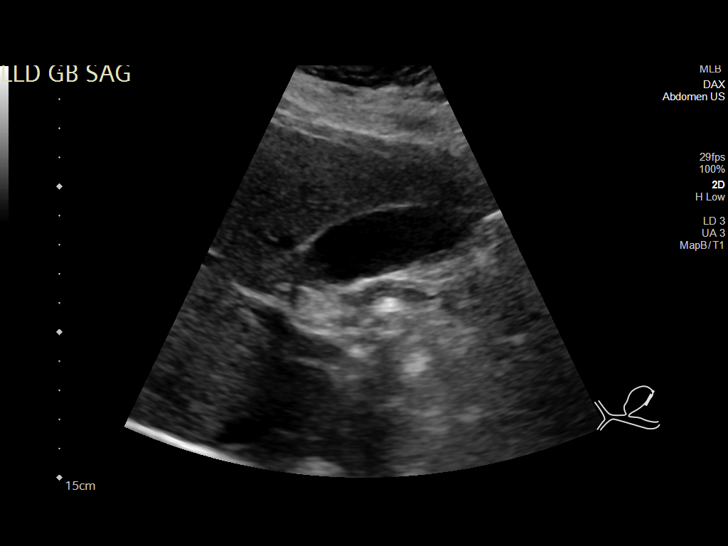
[im 12/35]
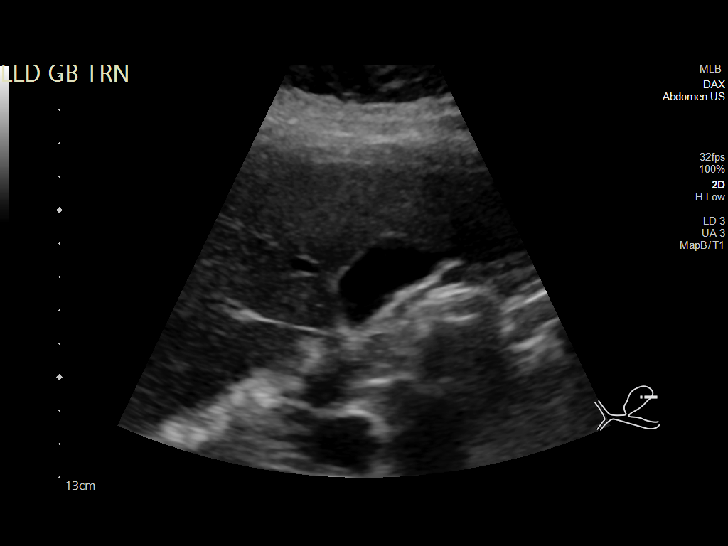
[im 13/35]
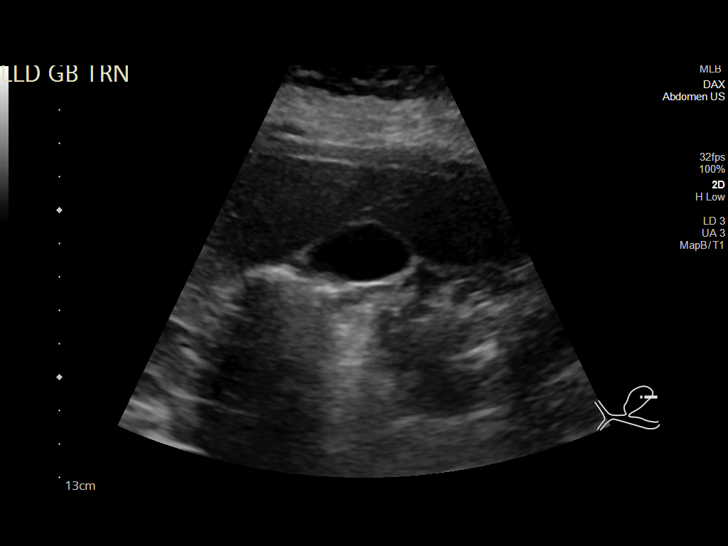
[im 16/35]
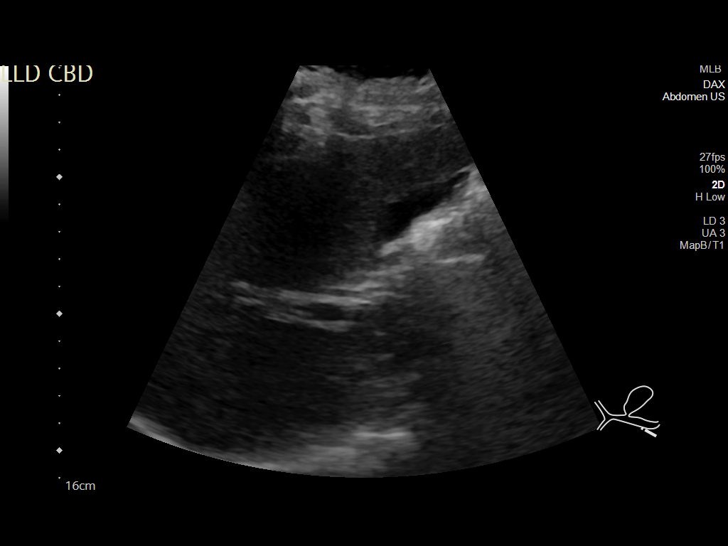
[im 19/35]
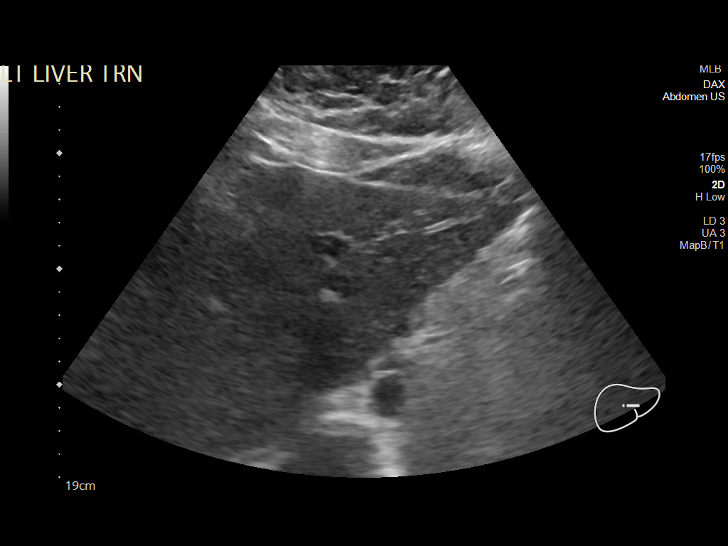
[im 22/35]
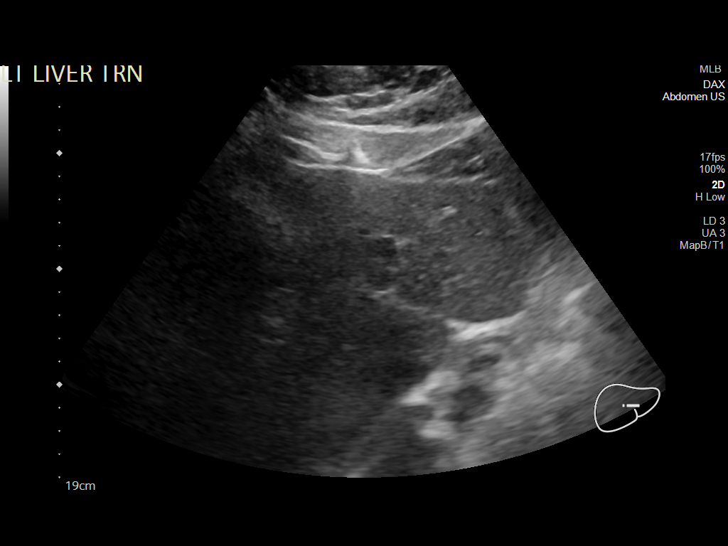
[im 23/35]
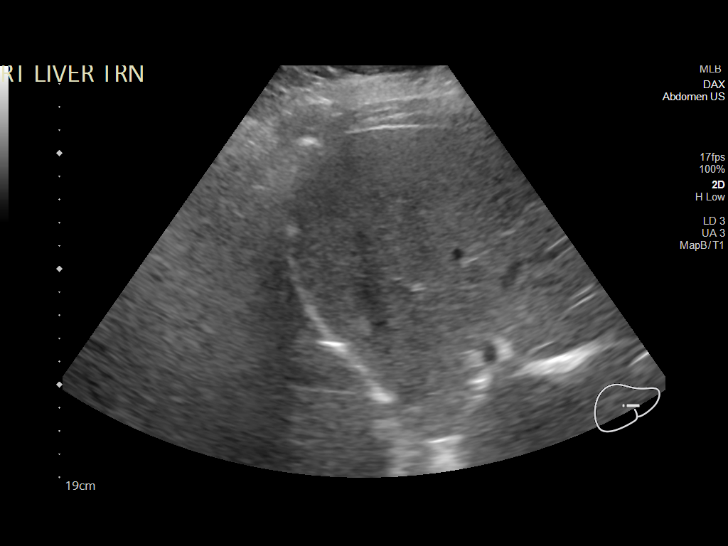
[im 26/35]
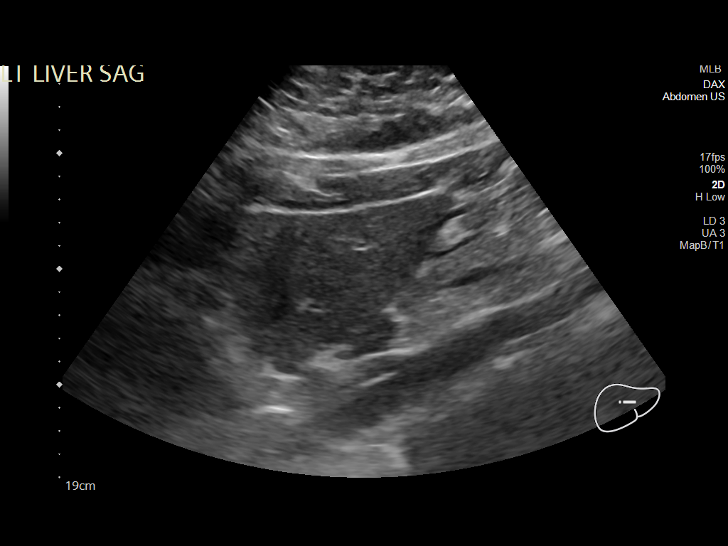
[im 29/35]
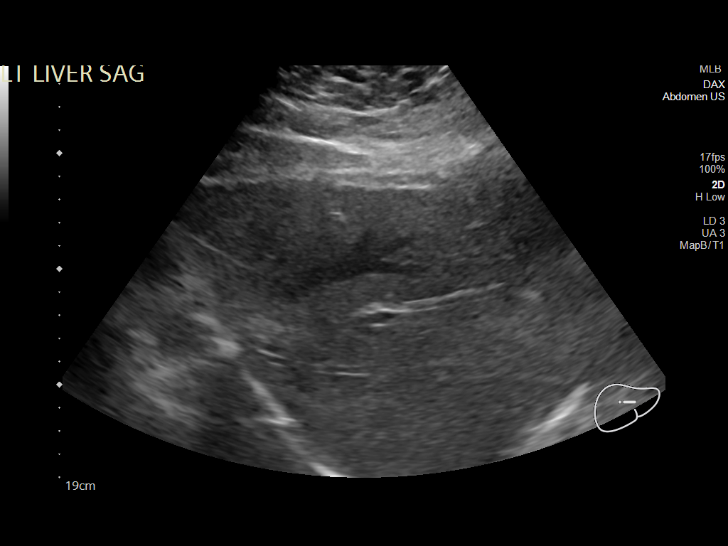
[im 32/35]
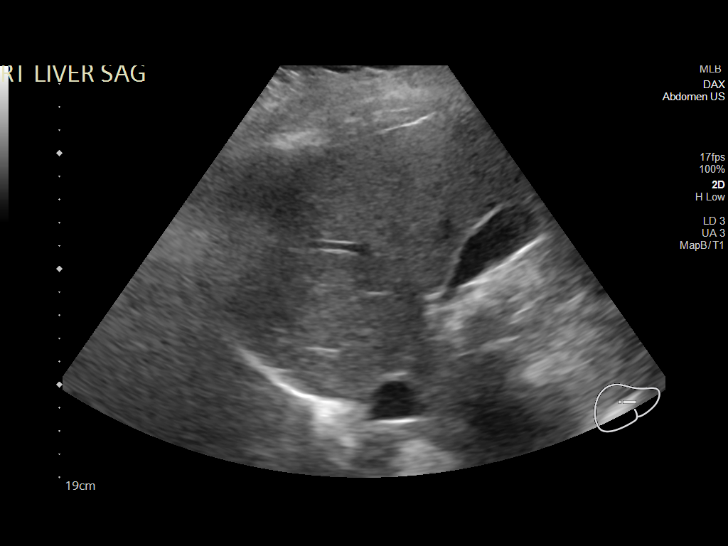
[im 35/35]
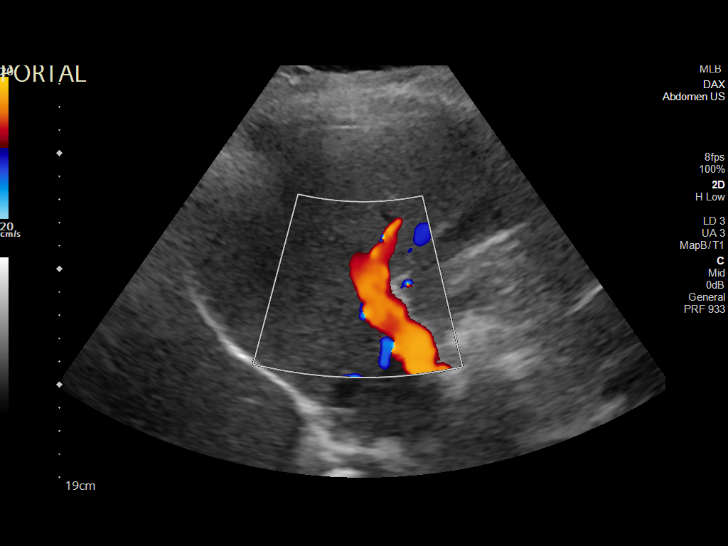

[14 of 25 positions shown; findings below may reference images not displayed]

FINDINGS: Gallbladder:

No gallstones or wall thickening visualized. No sonographic Murphy
sign noted by sonographer.

Common bile duct:

Diameter: 3.7 mm

Liver:

Normal echogenicity without focal lesion or biliary dilatation.
Portal vein is patent on color Doppler imaging with normal direction
of blood flow towards the liver.

Other: None.
IMPRESSION: Unremarkable right upper quadrant ultrasound examination.

## 2022-10-14 ENCOUNTER — Other Ambulatory Visit: Payer: Self-pay | Admitting: Ophthalmology

## 2022-10-14 DIAGNOSIS — H538 Other visual disturbances: Secondary | ICD-10-CM

## 2022-10-14 DIAGNOSIS — H4710 Unspecified papilledema: Secondary | ICD-10-CM

## 2022-10-14 DIAGNOSIS — G4489 Other headache syndrome: Secondary | ICD-10-CM

## 2022-10-17 ENCOUNTER — Other Ambulatory Visit: Payer: Self-pay | Admitting: Ophthalmology

## 2022-10-17 DIAGNOSIS — G4489 Other headache syndrome: Secondary | ICD-10-CM

## 2022-10-17 DIAGNOSIS — H4710 Unspecified papilledema: Secondary | ICD-10-CM

## 2022-10-17 DIAGNOSIS — H538 Other visual disturbances: Secondary | ICD-10-CM

## 2022-10-28 NOTE — Discharge Instructions (Signed)

## 2022-10-31 ENCOUNTER — Encounter: Payer: Self-pay | Admitting: Ophthalmology

## 2022-10-31 ENCOUNTER — Ambulatory Visit
Admission: RE | Admit: 2022-10-31 | Discharge: 2022-10-31 | Disposition: A | Discharge status: Home / Self Care | Payer: BC Managed Care – PPO | Source: Ambulatory Visit | Attending: Ophthalmology | Admitting: Ophthalmology

## 2022-10-31 DIAGNOSIS — H4710 Unspecified papilledema: Secondary | ICD-10-CM

## 2022-10-31 DIAGNOSIS — H538 Other visual disturbances: Secondary | ICD-10-CM

## 2022-10-31 DIAGNOSIS — G4489 Other headache syndrome: Secondary | ICD-10-CM

## 2022-11-02 ENCOUNTER — Ambulatory Visit
Admission: RE | Admit: 2022-11-02 | Discharge: 2022-11-02 | Disposition: A | Discharge status: Home / Self Care | Payer: BC Managed Care – PPO | Source: Ambulatory Visit | Attending: Ophthalmology | Admitting: Ophthalmology

## 2022-11-02 DIAGNOSIS — H4710 Unspecified papilledema: Secondary | ICD-10-CM

## 2022-11-02 DIAGNOSIS — H538 Other visual disturbances: Secondary | ICD-10-CM

## 2022-11-02 DIAGNOSIS — G4489 Other headache syndrome: Secondary | ICD-10-CM

## 2022-11-02 MED ORDER — IOPAMIDOL (ISOVUE-370) INJECTION 76%
500.0000 mL | Freq: Once | INTRAVENOUS | Status: AC | PRN
  Administered 2022-11-02: 50 mL via INTRAVENOUS

## 2023-01-25 ENCOUNTER — Other Ambulatory Visit: Payer: Self-pay | Admitting: Medical Genetics

## 2023-02-01 ENCOUNTER — Other Ambulatory Visit (HOSPITAL_COMMUNITY): Payer: Self-pay | Attending: Medical Genetics

## 2023-10-25 ENCOUNTER — Other Ambulatory Visit: Payer: Self-pay | Admitting: Medical Genetics

## 2023-10-25 DIAGNOSIS — Z006 Encounter for examination for normal comparison and control in clinical research program: Secondary | ICD-10-CM

## 2023-11-14 ENCOUNTER — Other Ambulatory Visit: Payer: Self-pay

## 2023-11-14 ENCOUNTER — Emergency Department (HOSPITAL_BASED_OUTPATIENT_CLINIC_OR_DEPARTMENT_OTHER)
Admission: EM | Admit: 2023-11-14 | Discharge: 2023-11-14 | Disposition: A | Discharge status: Home / Self Care | Attending: Emergency Medicine | Admitting: Emergency Medicine

## 2023-11-14 DIAGNOSIS — K602 Anal fissure, unspecified: Secondary | ICD-10-CM | POA: Insufficient documentation

## 2023-11-14 DIAGNOSIS — K625 Hemorrhage of anus and rectum: Secondary | ICD-10-CM | POA: Diagnosis present

## 2023-11-14 LAB — COMPREHENSIVE METABOLIC PANEL WITH GFR
ALT: 20 U/L (ref 0–44)
AST: 18 U/L (ref 15–41)
Albumin: 4.4 g/dL (ref 3.5–5.0)
Alkaline Phosphatase: 79 U/L (ref 38–126)
Anion gap: 13 (ref 5–15)
BUN: 14 mg/dL (ref 6–20)
CO2: 24 mmol/L (ref 22–32)
Calcium: 9.5 mg/dL (ref 8.9–10.3)
Chloride: 103 mmol/L (ref 98–111)
Creatinine, Ser: 0.84 mg/dL (ref 0.44–1.00)
GFR, Estimated: >60 mL/min (ref >60)
Glucose, Bld: 96 mg/dL (ref 70–99)
Potassium: 4 mmol/L (ref 3.5–5.1)
Sodium: 139 mmol/L (ref 135–145)
Total Bilirubin: 0.3 mg/dL (ref 0.0–1.2)
Total Protein: 7.4 g/dL (ref 6.5–8.1)

## 2023-11-14 LAB — CBC
HCT: 43.1 % (ref 36.0–46.0)
Hemoglobin: 13.6 g/dL (ref 12.0–15.0)
MCH: 27 pg (ref 26.0–34.0)
MCHC: 31.6 g/dL (ref 30.0–36.0)
MCV: 85.5 fL (ref 80.0–100.0)
Platelets: 272 K/uL (ref 150–400)
RBC: 5.04 MIL/uL (ref 3.87–5.11)
RDW: 13.5 % (ref 11.5–15.5)
WBC: 9.8 K/uL (ref 4.0–10.5)
nRBC: 0 % (ref 0.0–0.2)

## 2023-11-14 MED ORDER — HYDROCORTISONE (PERIANAL) 2.5 % EX CREA: 1.0000 | TOPICAL_CREAM | Freq: Two times a day (BID) | CUTANEOUS | 0 refills | Status: AC

## 2023-11-14 NOTE — ED Triage Notes (Addendum)
 Pt POV reporting noting rectal bleeding when wiping x5 days, denies pain, hx hemorrhoids.

## 2023-11-14 NOTE — Discharge Instructions (Signed)
 Follow the care instructions provided. Use the topical cream twice daily. If symptoms do not resolve over the next 4-5 days, call [facility] for an appointment to discuss further treatment options.

## 2023-11-14 NOTE — ED Provider Notes (Signed)
 [place] [facility] AT [facility] Provider Note   CSN: [id number] Arrival date & time: 11/14/23  2029     Patient presents with: Rectal Bleeding   Anita Patton is a 33 y.o. female.   Patient to ED with rectal bleeding described as BRB when she wipes. Symptoms started 5 days ago when she notes she had a large, hard stool. She denies rectal pain. No urinary symptoms. No related abdominal pain, nausea or vomiting. She is not able to say whether stool appears normal in color.   The history is provided by the patient. No language interpreter was used.  Rectal Bleeding      Prior to Admission medications   Medication Sig Start Date End Date Taking? Authorizing Provider  hydrocortisone (ANUSOL-HC) 2.5 % rectal cream Place 1 Application rectally 2 (two) times daily. 11/14/23  Yes Bernabe Dorce, Margit, PA-C  amoxicillin  (AMOXIL ) 875 MG tablet Take 1 tablet (875 mg total) by mouth 2 (two) times daily. 07/04/19   Babara, Amy V, PA-C  ibuprofen  (ADVIL ) 200 MG tablet Take 200 mg by mouth every 6 (six) hours as needed.    [provider]  ofloxacin (FLOXIN) 0.3 % otic solution Place 3 drops into the right ear 3 (three) times daily.    [provider]  pantoprazole  (PROTONIX ) 40 MG tablet Take 1 tablet (40 mg total) by mouth daily. 03/10/21   Freddi Hamilton, MD  sucralfate  (CARAFATE ) 1 g tablet Take 1 tablet (1 g total) by mouth 4 (four) times daily -  with meals and at bedtime. 03/10/21   Freddi Hamilton, MD  etonogestrel -ethinyl estradiol  (NUVARING) 0.12-0.015 MG/24HR vaginal ring Place 1 each vaginally every 28 (twenty-eight) days. Insert vaginally and leave in place for 3 consecutive weeks, then remove for 1 week. 01/28/19 07/04/19  Rodgers Barnie RAMAN, CNM    Allergies: Patient has no known allergies.    Review of Systems  Gastrointestinal:  Positive for hematochezia.    Updated Vital Signs BP (!) 147/97 (BP Location: Left Arm)   Pulse 95   Temp 97.6 F  (36.4 C)   Resp 17   Ht 5' 2 (1.575 m)   Wt 131.5 kg   SpO2 100%   BMI 53.04 kg/m   Physical Exam Vitals and nursing note reviewed.  Constitutional:      Appearance: Normal appearance. She is obese.  Pulmonary:     Effort: Pulmonary effort is normal.  Genitourinary:    Comments: Small fissure at the 4 o'clock position of the rectum, easy to bleed. Nontender. No hemorrhoids visualized. No evidence of abscess.  Skin:    General: Skin is warm and dry.  Neurological:     Mental Status: She is alert and oriented to person, place, and time.     (all labs ordered are listed, but only abnormal results are displayed) Labs Reviewed  COMPREHENSIVE METABOLIC PANEL WITH GFR  CBC  PREGNANCY, URINE  POC OCCULT BLOOD, ED   Results for orders placed or performed during the hospital encounter of 11/14/23  Comprehensive metabolic panel   Collection Time: 11/14/23  8:52 PM  Result Value Ref Range   Sodium 139 135 - 145 mmol/L   Potassium 4.0 3.5 - 5.1 mmol/L   Chloride 103 98 - 111 mmol/L   CO2 24 22 - 32 mmol/L   Glucose, Bld 96 70 - 99 mg/dL   BUN 14 6 - 20 mg/dL   Creatinine, Ser 9.15 0.44 - 1.00 mg/dL   Calcium 9.5 8.9 -  10.3 mg/dL   Total Protein 7.4 6.5 - 8.1 g/dL   Albumin 4.4 3.5 - 5.0 g/dL   AST 18 15 - 41 U/L   ALT 20 0 - 44 U/L   Alkaline Phosphatase 79 38 - 126 U/L   Total Bilirubin 0.3 0.0 - 1.2 mg/dL   GFR, Estimated >39 >39 mL/min   Anion gap 13 5 - 15  CBC   Collection Time: 11/14/23  8:52 PM  Result Value Ref Range   WBC 9.8 4.0 - 10.5 K/uL   RBC 5.04 3.87 - 5.11 MIL/uL   Hemoglobin 13.6 12.0 - 15.0 g/dL   HCT 56.8 63.9 - 53.9 %   MCV 85.5 80.0 - 100.0 fL   MCH 27.0 26.0 - 34.0 pg   MCHC 31.6 30.0 - 36.0 g/dL   RDW 86.4 88.4 - 84.4 %   Platelets 272 150 - 400 K/uL   nRBC 0.0 0.0 - 0.2 %     EKG: None  Radiology: No results found.   Procedures   Medications Ordered in the ED - No data to display  Clinical Course as of 11/14/23 2146  Tue Nov 14, 2023  2139 Patient with BRB per rectum x 5 days. Anal fissure visualized on exam, with bleeding during exam. No abscess or hemorrhoid. Hemodynamically stable. Will provide topical hydrocortisone. Refer to surgery if bleeding continues for another 4-5 days.  [SU]    Clinical Course User Index [SU] Odell Balls, PA-C                                 Medical Decision Making Amount and/or Complexity of Data Reviewed Labs: ordered.        Final diagnoses:  Anal fissure    ED Discharge Orders          Ordered    hydrocortisone (ANUSOL-HC) 2.5 % rectal cream  2 times daily        11/14/23 2144               Odell Balls, PA-C 11/14/23 2146    Ellouise Richerd POUR, DO 11/14/23 2245
